# Patient Record
Sex: Female | Born: 1985 | Race: White | Hispanic: No | Marital: Married | Smoking: Never smoker
Health system: Southern US, Community
[De-identification: ages and names within clinical notes are randomized; demographics above are authoritative.]

## PROBLEM LIST (undated history)

## (undated) DIAGNOSIS — Z91410 Personal history of adult physical and sexual abuse: Secondary | ICD-10-CM

## (undated) DIAGNOSIS — Z789 Other specified health status: Secondary | ICD-10-CM

## (undated) HISTORY — PX: KNEE SURGERY: SHX244

## (undated) HISTORY — PX: ANKLE SURGERY: SHX546

## (undated) HISTORY — PX: CYSTOSCOPY W/ STONE MANIPULATION: SHX1427

## (undated) HISTORY — PX: TONSILLECTOMY: SUR1361

## (undated) HISTORY — PX: WISDOM TOOTH EXTRACTION: SHX21

## (undated) HISTORY — DX: Personal history of adult physical and sexual abuse: Z91.410

---
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
## 2012-01-17 ENCOUNTER — Telehealth: Payer: Self-pay

## 2012-01-17 NOTE — Telephone Encounter (Signed)
.  [facility] PT WOULD LIKE TO KNOW IF SHE CAN GET SOME ADDERALL. IS STARTING [employer] IN [place] AND WON'T BE ABLE TO GET A DR Breanna Davis PLEASE CALL [phone]

## 2012-01-17 NOTE — Telephone Encounter (Signed)
Pls pull chart and send msg to pa pool 

## 2012-01-18 NOTE — Telephone Encounter (Signed)
For Dr. Merla Riches to review.  Patient last seen 11/18/2009.

## 2012-01-18 NOTE — Telephone Encounter (Signed)
I'd like to help but since more than 12 months, can't prescribe w/o an office visit

## 2012-01-18 NOTE — Telephone Encounter (Signed)
Chart pulled to PA 

## 2012-01-19 ENCOUNTER — Encounter: Payer: Self-pay | Admitting: Radiology

## 2012-01-19 NOTE — Telephone Encounter (Signed)
Spoke with pt and let her know she will need to rtc for office visit.

## 2014-09-10 ENCOUNTER — Observation Stay (HOSPITAL_COMMUNITY)
Admission: EM | Admit: 2014-09-10 | Discharge: 2014-09-12 | Disposition: A | Discharge status: Home / Self Care | Payer: BC Managed Care – PPO | Attending: General Surgery | Admitting: General Surgery

## 2014-09-10 ENCOUNTER — Encounter (HOSPITAL_COMMUNITY): Payer: Self-pay | Admitting: Emergency Medicine

## 2014-09-10 DIAGNOSIS — K819 Cholecystitis, unspecified: Secondary | ICD-10-CM | POA: Diagnosis present

## 2014-09-10 DIAGNOSIS — R109 Unspecified abdominal pain: Secondary | ICD-10-CM

## 2014-09-10 DIAGNOSIS — K8000 Calculus of gallbladder with acute cholecystitis without obstruction: Secondary | ICD-10-CM | POA: Diagnosis present

## 2014-09-10 DIAGNOSIS — Z79899 Other long term (current) drug therapy: Secondary | ICD-10-CM | POA: Insufficient documentation

## 2014-09-10 DIAGNOSIS — Z419 Encounter for procedure for purposes other than remedying health state, unspecified: Secondary | ICD-10-CM

## 2014-09-10 DIAGNOSIS — K8010 Calculus of gallbladder with chronic cholecystitis without obstruction: Principal | ICD-10-CM | POA: Insufficient documentation

## 2014-09-10 DIAGNOSIS — R1031 Right lower quadrant pain: Secondary | ICD-10-CM

## 2014-09-10 LAB — CBC WITH DIFFERENTIAL/PLATELET
BASOS ABS: 0 10*3/uL (ref 0.0–0.1)
BASOS PCT: 0 % (ref 0–1)
EOS ABS: 0 10*3/uL (ref 0.0–0.7)
Eosinophils Relative: 0 % (ref 0–5)
HEMATOCRIT: 39.3 % (ref 36.0–46.0)
HEMOGLOBIN: 13.5 g/dL (ref 12.0–15.0)
LYMPHS PCT: 10 % — AB (ref 12–46)
Lymphs Abs: 1.3 10*3/uL (ref 0.7–4.0)
MCH: 33.5 pg (ref 26.0–34.0)
MCHC: 34.4 g/dL (ref 30.0–36.0)
MCV: 97.5 fL (ref 78.0–100.0)
MONOS PCT: 2 % — AB (ref 3–12)
Monocytes Absolute: 0.2 10*3/uL (ref 0.1–1.0)
NEUTROS PCT: 88 % — AB (ref 43–77)
Neutro Abs: 11.5 10*3/uL — ABNORMAL HIGH (ref 1.7–7.7)
Platelets: 391 10*3/uL (ref 150–400)
RBC: 4.03 MIL/uL (ref 3.87–5.11)
RDW: 12.5 % (ref 11.5–15.5)
WBC: 13.1 10*3/uL — ABNORMAL HIGH (ref 4.0–10.5)

## 2014-09-10 LAB — PREGNANCY, URINE: Preg Test, Ur: NEGATIVE

## 2014-09-10 LAB — URINALYSIS, ROUTINE W REFLEX MICROSCOPIC
Bilirubin Urine: NEGATIVE
Glucose, UA: NEGATIVE mg/dL
Hgb urine dipstick: NEGATIVE
KETONES UR: 15 mg/dL — AB
Nitrite: NEGATIVE
PH: 8.5 — AB (ref 5.0–8.0)
Protein, ur: 30 mg/dL — AB
Specific Gravity, Urine: 1.021 (ref 1.005–1.030)
Urobilinogen, UA: 0.2 mg/dL (ref 0.0–1.0)

## 2014-09-10 LAB — URINE MICROSCOPIC-ADD ON

## 2014-09-10 MED ORDER — SODIUM CHLORIDE 0.9 % IV SOLN
Freq: Once | INTRAVENOUS | Status: AC
  Administered 2014-09-10: via INTRAVENOUS

## 2014-09-10 MED ORDER — FENTANYL CITRATE 0.05 MG/ML IJ SOLN
75.0000 ug | Freq: Once | INTRAMUSCULAR | Status: AC
  Administered 2014-09-10: 75 ug via INTRAVENOUS
  Filled 2014-09-10: qty 2

## 2014-09-10 MED ORDER — ONDANSETRON HCL 4 MG/2ML IJ SOLN
4.0000 mg | Freq: Once | INTRAMUSCULAR | Status: AC
  Administered 2014-09-10: 4 mg via INTRAVENOUS
  Filled 2014-09-10: qty 2

## 2014-09-10 NOTE — ED Provider Notes (Signed)
CSN: [id number]     Arrival date & time 09/10/14  2240 History   First MD Initiated Contact with Patient 09/10/14 2303     Chief Complaint  Patient presents with  . Abdominal Pain  . Emesis     (Consider location/radiation/quality/duration/timing/severity/associated sxs/prior Treatment) HPI 28 yo female presents to the ER from home with complaint of right sided abdominal pain associated with nausea and vomiting starting this evening around 6 pm.  Pt denies fever, chills, urinary symptoms, vaginal discharge.  LMP 3 weeks ago. History reviewed. No pertinent past medical history. Past Surgical History  Procedure Laterality Date  . Tonsillectomy    . Knee surgery    . Ankle surgery    . Cystoscopy w/ stone manipulation     Family History  Problem Relation Age of Onset  . Stroke Mother   . Hypertension Other   . Stroke Other   . Diabetes Other    History  Substance Use Topics  . Smoking status: Never Smoker   . Smokeless tobacco: Not on file  . Alcohol Use: Yes     Comment: occ   OB History   Grav Para Term Preterm Abortions TAB SAB Ect Mult Living                 Review of Systems   See History of Present Illness; otherwise all other systems are reviewed and negative  Allergies  Review of patient's allergies indicates no known allergies.  Home Medications   Prior to Admission medications   Not on File   BP 119/87  Pulse 73  Temp(Src) 97.5 F (36.4 C) (Oral)  Resp 18  Ht 5' (1.524 m)  Wt 92 lb (41.731 kg)  BMI 17.97 kg/m2  SpO2 92%  LMP 08/14/2014 Physical Exam  Nursing note and vitals reviewed. Constitutional: She is oriented to person, place, and time. She appears well-developed and well-nourished.  HENT:  Head: Normocephalic and atraumatic.  Nose: Nose normal.  Mouth/Throat: Oropharynx is clear and moist.  Eyes: Conjunctivae and EOM are normal. Pupils are equal, round, and reactive to light.  Neck: Normal range of motion. Neck supple. No JVD  present. No tracheal deviation present. No thyromegaly present.  Cardiovascular: Normal rate, regular rhythm, normal heart sounds and intact distal pulses.  Exam reveals no gallop and no friction rub.   No murmur heard. Pulmonary/Chest: Effort normal and breath sounds normal. No stridor. No respiratory distress. She has no wheezes. She has no rales. She exhibits no tenderness.  Abdominal: Soft. Bowel sounds are normal. She exhibits no distension and no mass. There is tenderness (tenderness to palpation all along the right side of abdomen, no rebound or guarding). There is no rebound and no guarding.  Genitourinary:  External genitalia normal Vagina without discharge Cervix  normal negative for cervical motion tenderness Adnexa palpated, no masses or negative for tenderness noted Bladder palpated negative for tenderness Uterus palpated no masses or negative for tenderness    Musculoskeletal: Normal range of motion. She exhibits no edema and no tenderness.  Lymphadenopathy:    She has no cervical adenopathy.  Neurological: She is alert and oriented to person, place, and time. She displays normal reflexes. She exhibits normal muscle tone. Coordination normal.  Skin: Skin is warm and dry. No rash noted. No erythema. No pallor.  Psychiatric: She has a normal mood and affect. Her behavior is normal. Judgment and thought content normal.    ED Course  Procedures (including critical care time) Labs Review  Labs Reviewed  CBC WITH DIFFERENTIAL - Abnormal; Notable for the following:    WBC 13.1 (*)    Neutrophils Relative % 88 (*)    Neutro Abs 11.5 (*)    Lymphocytes Relative 10 (*)    Monocytes Relative 2 (*)    All other components within normal limits  BASIC METABOLIC PANEL - Abnormal; Notable for the following:    Potassium 3.6 (*)    Glucose, Bld 110 (*)    All other components within normal limits  URINALYSIS, ROUTINE W REFLEX MICROSCOPIC - Abnormal; Notable for the following:     APPearance TURBID (*)    pH 8.5 (*)    Ketones, ur 15 (*)    Protein, ur 30 (*)    Leukocytes, UA TRACE (*)    All other components within normal limits  URINE MICROSCOPIC-ADD ON - Abnormal; Notable for the following:    Squamous Epithelial / LPF FEW (*)    All other components within normal limits  HEPATIC FUNCTION PANEL - Abnormal; Notable for the following:    Albumin 3.1 (*)    All other components within normal limits  WET PREP, GENITAL  PREGNANCY, URINE    Imaging Review Ct Abdomen Pelvis W Contrast  09/11/2014   CLINICAL DATA:  Right lower quadrant pain  EXAM: CT ABDOMEN AND PELVIS WITH CONTRAST  TECHNIQUE: Multidetector CT imaging of the abdomen and pelvis was performed using the standard protocol following bolus administration of intravenous contrast.  CONTRAST:  80mL OMNIPAQUE IOHEXOL 300 MG/ML  SOLN  COMPARISON:  None.  FINDINGS: Lung bases clear. Fluid distention of the distal esophagus. Normal heart size.  Mild biliary ductal dilatation, intra and extrahepatic, appears to smoothly taper toward the level of the ampulla. The gallbladder is distended. Layering high density on series 2, image 30 2 May reflect stones or wall calcification. No wall thickening. Pericholecystic fluid, mild (series 2, image 28).  No appreciable abnormality of the pancreas, spleen, adrenal glands, kidneys. No hydroureteronephrosis.  No overt colitis. Normal appendix. Small bowel loops are of normal course and caliber. No free intraperitoneal air. No loculated fluid collection. No lymphadenopathy.  Normal caliber aorta and branch vessels.  Thin walled bladder. Nonspecific peripheral enhancement of the uterine parenchyma. No adnexal mass. Small amount of free fluid within the pelvis is presumably physiologic.  No acute osseous finding.  IMPRESSION: Distended gallbladder. Small amount of pericholecystic fluid. Acute cholecystitis is not excluded. Recommend ultrasound.  Mild biliary ductal prominence is  nonspecific. Correlate with LFTs and ERCP if warranted.  Fluid distention of the distal esophagus may reflect reflux.   Electronically Signed   By: Jearld Lesch M.D.   On: 09/11/2014 03:23   US Abdomen Limited  09/11/2014   CLINICAL DATA:  Abdominal pain  EXAM: US ABDOMEN LIMITED - RIGHT UPPER QUADRANT  COMPARISON:  09/11/2014 CT  FINDINGS: Gallbladder:  Gallstones. Mild gallbladder thickening up to 3 mm. Negative sonographic Murphy sign.  Common bile duct:  Diameter: 6 mm where seen. The distal duct is poorly visualized due to overlying bowel gas artifact.  Liver:  No focal lesion identified. Within normal limits in parenchymal echogenicity.  IMPRESSION: Cholelithiasis and mild gallbladder wall thickening however negative sonographic Murphy sign. Acute cholecystitis is not excluded in the appropriate clinical setting. If clinically equivocal, consider HIDA scan.  Similar mild proximal-mid CBD prominence. The distal duct is obscured.   Electronically Signed   By: Jearld Lesch M.D.   On: 09/11/2014 04:57  EKG Interpretation None      MDM   Final diagnoses:  RLQ abdominal pain  Abdominal pain  Calculus of gallbladder with acute cholecystitis without obstruction    28 year old female with acute onset of right-sided abdominal pain nausea and vomiting.  Plan for labs, pelvic exam, Zofran and fentanyl, IV fluids.  12:31 AM Pelvic exam normal, no adnexal pain.  Will proceed with CT abdomen and pelvis for possible appendicitis.  No signs of kidney stone or infected urine.  CBC noted to have slight elevation in WBC with left shift.  5:34 AM Enlarged gallbladder on CT with some pericholecystic fluid.  U/s with some GB wall thickening.  Pt has had persistent pain despite medications, nausea.  Still tender on my exam.  Will d/w surgery.  5:50 AM Case d/w Dr Johna SheriffHoxworth.  He requests holding order replaced.  He reports surgery team will see her at 7 AM.   Olivia Mackielga M Laurie Lovejoy, MD 09/11/14 [phone]

## 2014-09-10 NOTE — ED Notes (Signed)
Pt states she is having pain in her lower right quadrant that started about 6pm tonight  Pt states she has had vomiting with the pain  Denies back pain

## 2014-09-11 ENCOUNTER — Encounter (HOSPITAL_COMMUNITY): Payer: Self-pay

## 2014-09-11 ENCOUNTER — Emergency Department (HOSPITAL_COMMUNITY): Payer: BC Managed Care – PPO

## 2014-09-11 ENCOUNTER — Inpatient Hospital Stay (HOSPITAL_COMMUNITY): Payer: BC Managed Care – PPO

## 2014-09-11 ENCOUNTER — Encounter (HOSPITAL_COMMUNITY): Payer: BC Managed Care – PPO | Admitting: Anesthesiology

## 2014-09-11 ENCOUNTER — Inpatient Hospital Stay (HOSPITAL_COMMUNITY): Payer: BC Managed Care – PPO | Admitting: Anesthesiology

## 2014-09-11 ENCOUNTER — Encounter (HOSPITAL_COMMUNITY): Admission: EM | Disposition: A | Discharge status: Home / Self Care | Payer: Self-pay | Source: Home / Self Care

## 2014-09-11 DIAGNOSIS — K819 Cholecystitis, unspecified: Secondary | ICD-10-CM | POA: Diagnosis present

## 2014-09-11 DIAGNOSIS — K8000 Calculus of gallbladder with acute cholecystitis without obstruction: Secondary | ICD-10-CM | POA: Diagnosis present

## 2014-09-11 HISTORY — PX: CHOLECYSTECTOMY: SHX55

## 2014-09-11 LAB — BASIC METABOLIC PANEL
Anion gap: 14 (ref 5–15)
BUN: 9 mg/dL (ref 6–23)
CO2: 26 mEq/L (ref 19–32)
Calcium: 9.2 mg/dL (ref 8.4–10.5)
Chloride: 97 mEq/L (ref 96–112)
Creatinine, Ser: 0.69 mg/dL (ref 0.50–1.10)
GFR calc Af Amer: >90 mL/min (ref >90)
GFR calc non Af Amer: >90 mL/min (ref >90)
Glucose, Bld: 110 mg/dL — ABNORMAL HIGH (ref 70–99)
Potassium: 3.6 mEq/L — ABNORMAL LOW (ref 3.7–5.3)
SODIUM: 137 meq/L (ref 137–147)

## 2014-09-11 LAB — WET PREP, GENITAL
CLUE CELLS WET PREP: NONE SEEN
Trich, Wet Prep: NONE SEEN
WBC WET PREP: NONE SEEN
YEAST WET PREP: NONE SEEN

## 2014-09-11 LAB — SURGICAL PCR SCREEN
MRSA, PCR: NEGATIVE
Staphylococcus aureus: POSITIVE — AB

## 2014-09-11 LAB — HEPATIC FUNCTION PANEL
ALT: 13 U/L (ref 0–35)
AST: 21 U/L (ref 0–37)
Albumin: 3.1 g/dL — ABNORMAL LOW (ref 3.5–5.2)
Alkaline Phosphatase: 60 U/L (ref 39–117)
Total Bilirubin: 0.3 mg/dL (ref 0.3–1.2)
Total Protein: 6.2 g/dL (ref 6.0–8.3)

## 2014-09-11 SURGERY — LAPAROSCOPIC CHOLECYSTECTOMY WITH INTRAOPERATIVE CHOLANGIOGRAM
Anesthesia: General | Site: Abdomen

## 2014-09-11 MED ORDER — NEOSTIGMINE METHYLSULFATE 10 MG/10ML IV SOLN
INTRAVENOUS | Status: DC | PRN
Start: 2014-09-11 — End: 2014-09-11
  Administered 2014-09-11: 3 mg via INTRAVENOUS

## 2014-09-11 MED ORDER — ONDANSETRON HCL 4 MG/2ML IJ SOLN: 4.0000 mg | INTRAMUSCULAR | Status: DC | PRN

## 2014-09-11 MED ORDER — ONDANSETRON HCL 4 MG/2ML IJ SOLN
INTRAMUSCULAR | Status: AC
  Filled 2014-09-11: qty 2

## 2014-09-11 MED ORDER — ONDANSETRON HCL 4 MG PO TABS: 4.0000 mg | ORAL_TABLET | Freq: Four times a day (QID) | ORAL | Status: DC | PRN

## 2014-09-11 MED ORDER — SODIUM CHLORIDE 0.9 % IV SOLN
INTRAVENOUS | Status: AC
  Administered 2014-09-11: 08:00:00 via INTRAVENOUS

## 2014-09-11 MED ORDER — MORPHINE SULFATE 4 MG/ML IJ SOLN
4.0000 mg | Freq: Once | INTRAMUSCULAR | Status: AC
Start: 2014-09-11 — End: 2014-09-11
  Administered 2014-09-11: 4 mg via INTRAVENOUS
  Filled 2014-09-11: qty 1

## 2014-09-11 MED ORDER — MORPHINE SULFATE 2 MG/ML IJ SOLN: 2.0000 mg | INTRAMUSCULAR | Status: DC | PRN

## 2014-09-11 MED ORDER — FENTANYL CITRATE 0.05 MG/ML IJ SOLN
INTRAMUSCULAR | Status: AC
  Filled 2014-09-11: qty 2

## 2014-09-11 MED ORDER — GLYCOPYRROLATE 0.2 MG/ML IJ SOLN
INTRAMUSCULAR | Status: AC
  Filled 2014-09-11: qty 2

## 2014-09-11 MED ORDER — FENTANYL CITRATE 0.05 MG/ML IJ SOLN
100.0000 ug | Freq: Once | INTRAMUSCULAR | Status: AC
  Administered 2014-09-11: 100 ug via INTRAVENOUS
  Filled 2014-09-11: qty 2

## 2014-09-11 MED ORDER — AMPHETAMINE-DEXTROAMPHETAMINE 20 MG PO TABS
20.0000 mg | ORAL_TABLET | Freq: Two times a day (BID) | ORAL | Status: DC
  Administered 2014-09-12: 20 mg via ORAL
  Filled 2014-09-11 (×2): qty 1

## 2014-09-11 MED ORDER — FENTANYL CITRATE 0.05 MG/ML IJ SOLN: 25.0000 ug | INTRAMUSCULAR | Status: DC | PRN

## 2014-09-11 MED ORDER — KCL-LACTATED RINGERS-D5W 20 MEQ/L IV SOLN
INTRAVENOUS | Status: DC
  Administered 2014-09-11 – 2014-09-12 (×2): 100 mL/h via INTRAVENOUS
  Filled 2014-09-11 (×3): qty 1000

## 2014-09-11 MED ORDER — LIDOCAINE HCL (CARDIAC) 20 MG/ML IV SOLN
INTRAVENOUS | Status: DC | PRN
  Administered 2014-09-11: 100 mg via INTRAVENOUS

## 2014-09-11 MED ORDER — IOHEXOL 300 MG/ML  SOLN
50.0000 mL | Freq: Once | INTRAMUSCULAR | Status: AC | PRN
  Administered 2014-09-11: 50 mL via ORAL

## 2014-09-11 MED ORDER — PHENYLEPHRINE HCL 10 MG/ML IJ SOLN
INTRAMUSCULAR | Status: DC | PRN
  Administered 2014-09-11: 80 ug via INTRAVENOUS

## 2014-09-11 MED ORDER — FENTANYL CITRATE 0.05 MG/ML IJ SOLN
100.0000 ug | INTRAMUSCULAR | Status: DC | PRN
  Administered 2014-09-11: 100 ug via INTRAVENOUS
  Filled 2014-09-11: qty 2

## 2014-09-11 MED ORDER — MIDAZOLAM HCL 5 MG/5ML IJ SOLN
INTRAMUSCULAR | Status: DC | PRN
  Administered 2014-09-11: 2 mg via INTRAVENOUS

## 2014-09-11 MED ORDER — PROPOFOL 10 MG/ML IV BOLUS
INTRAVENOUS | Status: AC
  Filled 2014-09-11: qty 20

## 2014-09-11 MED ORDER — BUPIVACAINE HCL 0.5 % IJ SOLN
INTRAMUSCULAR | Status: DC | PRN
  Administered 2014-09-11: 15 mL

## 2014-09-11 MED ORDER — DIPHENHYDRAMINE HCL 50 MG/ML IJ SOLN
12.5000 mg | Freq: Once | INTRAMUSCULAR | Status: AC
  Administered 2014-09-11: 12.5 mg via INTRAVENOUS
  Filled 2014-09-11: qty 1

## 2014-09-11 MED ORDER — ONDANSETRON HCL 4 MG/2ML IJ SOLN
4.0000 mg | Freq: Three times a day (TID) | INTRAMUSCULAR | Status: DC | PRN
  Administered 2014-09-11: 4 mg via INTRAVENOUS
  Filled 2014-09-11: qty 2

## 2014-09-11 MED ORDER — CEFAZOLIN SODIUM-DEXTROSE 2-3 GM-% IV SOLR
INTRAVENOUS | Status: AC
  Filled 2014-09-11: qty 50

## 2014-09-11 MED ORDER — IOHEXOL 300 MG/ML  SOLN
80.0000 mL | Freq: Once | INTRAMUSCULAR | Status: AC | PRN
  Administered 2014-09-11: 80 mL via INTRAVENOUS

## 2014-09-11 MED ORDER — DEXAMETHASONE SODIUM PHOSPHATE 10 MG/ML IJ SOLN
INTRAMUSCULAR | Status: DC | PRN
  Administered 2014-09-11: 10 mg via INTRAVENOUS

## 2014-09-11 MED ORDER — ROCURONIUM BROMIDE 100 MG/10ML IV SOLN
INTRAVENOUS | Status: DC | PRN
Start: 2014-09-11 — End: 2014-09-11
  Administered 2014-09-11: 20 mg via INTRAVENOUS

## 2014-09-11 MED ORDER — HEPARIN SODIUM (PORCINE) 5000 UNIT/ML IJ SOLN
5000.0000 [IU] | Freq: Three times a day (TID) | INTRAMUSCULAR | Status: DC
  Filled 2014-09-11 (×3): qty 1

## 2014-09-11 MED ORDER — DEXAMETHASONE SODIUM PHOSPHATE 10 MG/ML IJ SOLN
INTRAMUSCULAR | Status: AC
  Filled 2014-09-11: qty 1

## 2014-09-11 MED ORDER — LACTATED RINGERS IV SOLN
INTRAVENOUS | Status: DC
Start: 2014-09-11 — End: 2014-09-11
  Administered 2014-09-11: 14:00:00 via INTRAVENOUS
  Administered 2014-09-11: 1000 mL via INTRAVENOUS

## 2014-09-11 MED ORDER — IOHEXOL 300 MG/ML  SOLN
INTRAMUSCULAR | Status: DC | PRN
  Administered 2014-09-11: 2 mL

## 2014-09-11 MED ORDER — PHENYLEPHRINE 40 MCG/ML (10ML) SYRINGE FOR IV PUSH (FOR BLOOD PRESSURE SUPPORT)
PREFILLED_SYRINGE | INTRAVENOUS | Status: AC
  Filled 2014-09-11: qty 10

## 2014-09-11 MED ORDER — PROPOFOL 10 MG/ML IV BOLUS
INTRAVENOUS | Status: DC | PRN
  Administered 2014-09-11: 150 mg via INTRAVENOUS

## 2014-09-11 MED ORDER — LACTATED RINGERS IR SOLN
Status: DC | PRN
  Administered 2014-09-11: 1000 mL

## 2014-09-11 MED ORDER — CEFAZOLIN SODIUM-DEXTROSE 2-3 GM-% IV SOLR
2.0000 g | Freq: Once | INTRAVENOUS | Status: AC
  Administered 2014-09-11: 2 g via INTRAVENOUS
  Filled 2014-09-11: qty 50

## 2014-09-11 MED ORDER — KETOROLAC TROMETHAMINE 30 MG/ML IJ SOLN
INTRAMUSCULAR | Status: DC | PRN
  Administered 2014-09-11: 30 mg via INTRAVENOUS

## 2014-09-11 MED ORDER — ONDANSETRON HCL 4 MG/2ML IJ SOLN: 4.0000 mg | Freq: Once | INTRAMUSCULAR | Status: DC | PRN

## 2014-09-11 MED ORDER — 0.9 % SODIUM CHLORIDE (POUR BTL) OPTIME
TOPICAL | Status: DC | PRN
  Administered 2014-09-11: 1000 mL

## 2014-09-11 MED ORDER — SUCCINYLCHOLINE CHLORIDE 20 MG/ML IJ SOLN
INTRAMUSCULAR | Status: DC | PRN
  Administered 2014-09-11: 100 mg via INTRAVENOUS

## 2014-09-11 MED ORDER — FENTANYL CITRATE 0.05 MG/ML IJ SOLN
INTRAMUSCULAR | Status: DC | PRN
  Administered 2014-09-11 (×4): 50 ug via INTRAVENOUS

## 2014-09-11 MED ORDER — KETOROLAC TROMETHAMINE 30 MG/ML IJ SOLN
INTRAMUSCULAR | Status: AC
  Filled 2014-09-11: qty 1

## 2014-09-11 MED ORDER — BUPIVACAINE HCL (PF) 0.5 % IJ SOLN
INTRAMUSCULAR | Status: AC
  Filled 2014-09-11: qty 30

## 2014-09-11 MED ORDER — OXYCODONE-ACETAMINOPHEN 5-325 MG PO TABS
1.0000 | ORAL_TABLET | ORAL | Status: DC | PRN
  Administered 2014-09-11: 1 via ORAL
  Filled 2014-09-11: qty 1

## 2014-09-11 MED ORDER — CEFAZOLIN SODIUM-DEXTROSE 2-3 GM-% IV SOLR
2.0000 g | Freq: Once | INTRAVENOUS | Status: AC
  Administered 2014-09-11: 2 g via INTRAVENOUS

## 2014-09-11 MED ORDER — MIDAZOLAM HCL 2 MG/2ML IJ SOLN
INTRAMUSCULAR | Status: AC
  Filled 2014-09-11: qty 2

## 2014-09-11 MED ORDER — CEFAZOLIN SODIUM 1-5 GM-% IV SOLN
1.0000 g | Freq: Four times a day (QID) | INTRAVENOUS | Status: AC
  Administered 2014-09-11 – 2014-09-12 (×3): 1 g via INTRAVENOUS
  Filled 2014-09-11 (×3): qty 50

## 2014-09-11 MED ORDER — ONDANSETRON HCL 4 MG/2ML IJ SOLN
INTRAMUSCULAR | Status: DC | PRN
Start: 2014-09-11 — End: 2014-09-11
  Administered 2014-09-11: 4 mg via INTRAVENOUS

## 2014-09-11 MED ORDER — ONDANSETRON HCL 4 MG/2ML IJ SOLN
4.0000 mg | Freq: Once | INTRAMUSCULAR | Status: AC
  Administered 2014-09-11: 4 mg via INTRAVENOUS
  Filled 2014-09-11: qty 2

## 2014-09-11 MED ORDER — GLYCOPYRROLATE 0.2 MG/ML IJ SOLN
INTRAMUSCULAR | Status: DC | PRN
Start: 2014-09-11 — End: 2014-09-11
  Administered 2014-09-11: 0.4 mg via INTRAVENOUS

## 2014-09-11 SURGICAL SUPPLY — 50 items
APL SKNCLS STERI-STRIP NONHPOA (GAUZE/BANDAGES/DRESSINGS) ×1
APPLIER CLIP 5 13 M/L LIGAMAX5 (MISCELLANEOUS) ×2
APR CLP MED LRG 5 ANG JAW (MISCELLANEOUS) ×1
BAG SPEC RTRVL LRG 6X4 10 (ENDOMECHANICALS) ×1
BENZOIN TINCTURE PRP APPL 2/3 (GAUZE/BANDAGES/DRESSINGS) ×2 IMPLANT
CATH REDDICK CHOLANGI 4FR 50CM (CATHETERS) ×1 IMPLANT
CHLORAPREP W/TINT 26ML (MISCELLANEOUS) ×2 IMPLANT
CLIP APPLIE 5 13 M/L LIGAMAX5 (MISCELLANEOUS) ×1 IMPLANT
COVER MAYO STAND STRL (DRAPES) ×2 IMPLANT
DISSECTOR BLUNT TIP ENDO 5MM (MISCELLANEOUS) IMPLANT
DRAPE C-ARM 42X120 X-RAY (DRAPES) ×2 IMPLANT
DRAPE LAPAROSCOPIC ABDOMINAL (DRAPES) ×2 IMPLANT
DRAPE UTILITY XL STRL (DRAPES) ×2 IMPLANT
DRSG TEGADERM 2-3/8X2-3/4 SM (GAUZE/BANDAGES/DRESSINGS) ×6 IMPLANT
ELECT REM PT RETURN 9FT ADLT (ELECTROSURGICAL) ×2
ELECTRODE REM PT RTRN 9FT ADLT (ELECTROSURGICAL) ×1 IMPLANT
ENDOLOOP SUT PDS II  0 18 (SUTURE)
ENDOLOOP SUT PDS II 0 18 (SUTURE) IMPLANT
GAUZE SPONGE 2X2 8PLY STRL LF (GAUZE/BANDAGES/DRESSINGS) ×1 IMPLANT
GLOVE BIO SURGEON STRL SZ7.5 (GLOVE) ×1 IMPLANT
GLOVE BIOGEL PI IND STRL 6.5 (GLOVE) IMPLANT
GLOVE BIOGEL PI IND STRL 7.5 (GLOVE) IMPLANT
GLOVE BIOGEL PI IND STRL 8 (GLOVE) IMPLANT
GLOVE BIOGEL PI INDICATOR 6.5 (GLOVE) ×1
GLOVE BIOGEL PI INDICATOR 7.5 (GLOVE) ×1
GLOVE BIOGEL PI INDICATOR 8 (GLOVE) ×1
GLOVE ECLIPSE 7.5 STRL STRAW (GLOVE) ×1 IMPLANT
GLOVE ECLIPSE 8.0 STRL XLNG CF (GLOVE) ×2 IMPLANT
GLOVE INDICATOR 8.0 STRL GRN (GLOVE) ×2 IMPLANT
GOWN STRL REUS W/TWL 2XL LVL3 (GOWN DISPOSABLE) ×1 IMPLANT
GOWN STRL REUS W/TWL XL LVL3 (GOWN DISPOSABLE) ×5 IMPLANT
HEMOSTAT SNOW SURGICEL 2X4 (HEMOSTASIS) ×1 IMPLANT
IV CATH 14GX2 1/4 (CATHETERS) ×1 IMPLANT
KIT BASIN OR (CUSTOM PROCEDURE TRAY) ×2 IMPLANT
POUCH SPECIMEN RETRIEVAL 10MM (ENDOMECHANICALS) ×2 IMPLANT
SCISSORS LAP 5X35 DISP (ENDOMECHANICALS) ×2 IMPLANT
SET CHOLANGIOGRAPH MIX (MISCELLANEOUS) ×2 IMPLANT
SET IRRIG TUBING LAPAROSCOPIC (IRRIGATION / IRRIGATOR) ×2 IMPLANT
SLEEVE XCEL OPT CAN 5 100 (ENDOMECHANICALS) ×4 IMPLANT
SOLUTION ANTI FOG 6CC (MISCELLANEOUS) ×2 IMPLANT
SPONGE GAUZE 2X2 STER 10/PKG (GAUZE/BANDAGES/DRESSINGS) ×1
STRIP CLOSURE SKIN 1/2X4 (GAUZE/BANDAGES/DRESSINGS) ×2 IMPLANT
SUT MNCRL AB 4-0 PS2 18 (SUTURE) ×2 IMPLANT
TOWEL OR 17X26 10 PK STRL BLUE (TOWEL DISPOSABLE) ×2 IMPLANT
TOWEL OR NON WOVEN STRL DISP B (DISPOSABLE) ×2 IMPLANT
TRAY LAPAROSCOPIC (CUSTOM PROCEDURE TRAY) ×2 IMPLANT
TROCAR BLADELESS OPT 5 100 (ENDOMECHANICALS) ×2 IMPLANT
TROCAR XCEL BLUNT TIP 100MML (ENDOMECHANICALS) ×2 IMPLANT
TROCAR XCEL NON-BLD 11X100MML (ENDOMECHANICALS) IMPLANT
TUBING INSUFFLATION 10FT LAP (TUBING) ×2 IMPLANT

## 2014-09-11 NOTE — Anesthesia Postprocedure Evaluation (Signed)
Anesthesia Post Note  Patient: Breanna Davis  Procedure(s) Performed: Procedure(s) (LRB): LAPAROSCOPIC CHOLECYSTECTOMY WITH INTRAOPERATIVE CHOLANGIOGRAM (N/A)  Anesthesia type: General  Patient location: PACU  Post pain: Pain level controlled  Post assessment: Post-op Vital signs reviewed  Last Vitals: BP 98/63  Pulse 79  Temp(Src) 36.5 C (Oral)  Resp 18  Ht 5' (1.524 m)  Wt 92 lb (41.731 kg)  BMI 17.97 kg/m2  SpO2 100%  LMP 08/14/2014  Post vital signs: Reviewed  Level of consciousness: sedated  Complications: No apparent anesthesia complications

## 2014-09-11 NOTE — Op Note (Signed)
Preoperative diagnosis:  Acute calculus cholecystitis  Postoperative diagnosis:  same  Procedure: Laparoscopic cholecystectomy with cholangiogram.  Surgeon: Avel Peaceodd Suede Greenawalt, M.D.  Asst.:  none  Anesthesia: General  Indication:   This is a 28 year old female admitted early this morning with acute calculus cholecystitis. She now is brought to the operating room for cholecystectomy.  Technique: She was brought to the operating room, placed supine on the operating table, and a general anesthetic was administered. The abdominal wall was then sterilely prepped and draped. Local anesthetic (Marcaine) was infiltrated in the subumbilical region. A small subumbilical incision was made through the skin, subcutaneous tissue, fascia, and peritoneum entering the peritoneal cavity under direct vision. A pursestring suture of 0 Vicryl was placed around the edges of the fascia. A Hassan trocar was introduced into the peritoneal cavity and a pneumoperitoneum was created by insufflation of carbon dioxide gas. The laparoscope was introduced into the trocar and no underlying bleeding or organ injury was noted. She was then placed in the reverse Trendelenburg position with the right side tilted slightly up.  Three 5 mm trocars were then placed into the abdominal cavity under laparoscopic vision. One in the epigastric area, and 2 in the right upper quadrant area. The gallbladder was visualized.  It showed signs of acute inflammation. It was tense and edematous. Needle decompression was performed and white bile was evacuated. The fundus was grasped and retracted toward the right shoulder.  The infundibulum was mobilized with dissection close to the gallbladder and retracted laterally. The cystic duct was identified and a window was created around it. An anterior branch of the cystic artery was  Identified running anterior to the cystic duct and a window was created around it. It was clipped and divided. The critical view  was achieved. A clip was placed at the neck of the gallbladder. A small incision was made in the cystic duct. A cholangiocatheter was introduced through the anterior abdominal wall and placed in the cystic duct. A intraoperative cholangiogram was then performed.  Under real-time fluoroscopy, dilute contrast was injected into the cystic duct.  The common hepatic duct, the right and left hepatic ducts, and the common duct were all visualized. Contrast drained into the duodenum without obvious evidence of any obstructing ductal lesion. The final report is pending the Radiologist's interpretation.  The cholangiocatheter was removed, the cystic duct was clipped 3 times on the biliary side, and then the cystic duct was divided sharply. No bile leak was noted from the cystic duct stump.  A posterior branch of the cystic artery was identified, isolated, then clipped and divided. Following this the gallbladder was dissected free from the liver using electrocautery. The gallbladder was then placed in a retrieval bag and removed from the abdominal cavity through the subumbilical incision.  The gallbladder fossa was inspected, irrigated, and bleeding was controlled with electrocautery. Inspection showed that hemostasis was adequate and there was no evidence of bile leak.  The irrigation fluid was evacuated as much as possible.  A piece of Surgicel was placed in the gallbladder fossa.  The subumbilical trocar was removed and the fascial defect was closed by tightening and tying down the pursestring suture under laparoscopic vision.  The remaining trocars were removed and the pneumoperitoneum was released. The skin incisions were closed with 4-0 Monocryl subcuticular stitches. Steri-Strips and sterile dressings were applied.  The procedure was well-tolerated without any apparent complications. She was taken to the recovery room in satisfactory condition.

## 2014-09-11 NOTE — Anesthesia Preprocedure Evaluation (Addendum)
Anesthesia Evaluation  Patient identified by MRN, date of birth, ID band Patient awake    Reviewed: Allergy & Precautions, H&P , NPO status , Patient's Chart, lab work & pertinent test results  History of Anesthesia Complications Negative for: history of anesthetic complications  Airway Mallampati: II  TM Distance: >3 FB Neck ROM: Full    Dental no notable dental hx. (+) Dental Advisory Given, Teeth Intact   Pulmonary neg pulmonary ROS,  breath sounds clear to auscultation  Pulmonary exam normal       Cardiovascular negative cardio ROS  Rhythm:Regular Rate:Normal     Neuro/Psych ADHDnegative neurological ROS  negative psych ROS   GI/Hepatic Neg liver ROS, GERD-  Medicated and Controlled,  Endo/Other  negative endocrine ROS  Renal/GU negative Renal ROS  negative genitourinary   Musculoskeletal negative musculoskeletal ROS (+)   Abdominal   Peds negative pediatric ROS (+)  Hematology negative hematology ROS (+)   Anesthesia Other Findings Seasonal allergies  Reproductive/Obstetrics negative OB ROS                            Anesthesia Physical Anesthesia Plan  ASA: II  Anesthesia Plan: General   Post-op Pain Management:    Induction: Intravenous, Rapid sequence and Cricoid pressure planned  Airway Management Planned: Oral ETT  Additional Equipment:   Intra-op Plan:   Post-operative Plan: Extubation in OR  Informed Consent: I have reviewed the patients History and Physical, chart, labs and discussed the procedure including the risks, benefits and alternatives for the proposed anesthesia with the patient or authorized representative who has indicated his/her understanding and acceptance.   Dental advisory given  Plan Discussed with: CRNA  Anesthesia Plan Comments:         Anesthesia Quick Evaluation

## 2014-09-11 NOTE — ED Notes (Signed)
MD at bedside. 

## 2014-09-11 NOTE — Transfer of Care (Signed)
Immediate Anesthesia Transfer of Care Note  Patient: Breanna Davis  Procedure(s) Performed: Procedure(s): LAPAROSCOPIC CHOLECYSTECTOMY WITH INTRAOPERATIVE CHOLANGIOGRAM (N/A)  Patient Location: PACU  Anesthesia Type:General  Level of Consciousness: sedated  Airway & Oxygen Therapy: Patient Spontanous Breathing and Patient connected to face mask oxygen  Post-op Assessment: Report given to PACU RN and Post -op Vital signs reviewed and stable  Post vital signs: Reviewed and stable  Complications: No apparent anesthesia complications

## 2014-09-11 NOTE — H&P (Signed)
Breanna Davis is an 28 y.o. female.   Chief Complaint: Abdominal pain with nausea and vomiting HPI: this is a 28 year old female who awoke yesterday morning with some transient epigastric pain. It resolved and she was able to go throughout her day. Last night however she had significant pain just to the right the umbilicus that was severe and was associated with nausea and vomiting. No fever or chills. She presented to the emergency department and had findings concerning for for acute cholecystitis. This is based on CT and ultrasound. Liver function tests were not elevated. White blood cell count was elevated. She was admitted to the hospital.  History reviewed. No pertinent past medical history.  Past Surgical History  Procedure Laterality Date  . Tonsillectomy    . Knee surgery    . Ankle surgery    . Cystoscopy w/ stone manipulation      Family History  Problem Relation Age of Onset  . Stroke Mother   . Hypertension Other   . Stroke Other   . Diabetes Other    Social History:  reports that she has never smoked. She does not have any smokeless tobacco history on file. She reports that she drinks alcohol. She reports that she does not use illicit drugs.  Allergies: No Known Allergies  Medications Prior to Admission  Medication Sig Dispense Refill  . amphetamine-dextroamphetamine (ADDERALL) 20 MG tablet Take 20 mg by mouth 2 (two) times daily.      . drospirenone-ethinyl estradiol (YAZ,GIANVI,LORYNA) 3-0.02 MG tablet Take 1 tablet by mouth daily.      Marland Kitchen loratadine (CLARITIN) 10 MG tablet Take 10 mg by mouth daily.      . valACYclovir (VALTREX) 1000 MG tablet Take 1,000 mg by mouth daily.        Results for orders placed during the hospital encounter of 09/10/14 (from the past 48 hour(s))  URINALYSIS, ROUTINE W REFLEX MICROSCOPIC     Status: Abnormal   Collection Time    09/10/14 11:17 PM      Result Value Ref Range   Color, Urine YELLOW  YELLOW   APPearance TURBID (*)  CLEAR   Specific Gravity, Urine 1.021  1.005 - 1.030   pH 8.5 (*) 5.0 - 8.0   Glucose, UA NEGATIVE  NEGATIVE mg/dL   Hgb urine dipstick NEGATIVE  NEGATIVE   Bilirubin Urine NEGATIVE  NEGATIVE   Ketones, ur 15 (*) NEGATIVE mg/dL   Protein, ur 30 (*) NEGATIVE mg/dL   Urobilinogen, UA 0.2  0.0 - 1.0 mg/dL   Nitrite NEGATIVE  NEGATIVE   Leukocytes, UA TRACE (*) NEGATIVE  PREGNANCY, URINE     Status: None   Collection Time    09/10/14 11:17 PM      Result Value Ref Range   Preg Test, Ur NEGATIVE  NEGATIVE   Comment:            THE SENSITIVITY OF THIS     METHODOLOGY IS >20 mIU/mL.  URINE MICROSCOPIC-ADD ON     Status: Abnormal   Collection Time    09/10/14 11:17 PM      Result Value Ref Range   Squamous Epithelial / LPF FEW (*) RARE   Urine-Other AMORPHOUS URATES/PHOSPHATES    CBC WITH DIFFERENTIAL     Status: Abnormal   Collection Time    09/10/14 11:43 PM      Result Value Ref Range   WBC 13.1 (*) 4.0 - 10.5 K/uL   RBC 4.03  3.87 -  5.11 MIL/uL   Hemoglobin 13.5  12.0 - 15.0 g/dL   HCT 39.3  36.0 - 46.0 %   MCV 97.5  78.0 - 100.0 fL   MCH 33.5  26.0 - 34.0 pg   MCHC 34.4  30.0 - 36.0 g/dL   RDW 12.5  11.5 - 15.5 %   Platelets 391  150 - 400 K/uL   Neutrophils Relative % 88 (*) 43 - 77 %   Neutro Abs 11.5 (*) 1.7 - 7.7 K/uL   Lymphocytes Relative 10 (*) 12 - 46 %   Lymphs Abs 1.3  0.7 - 4.0 K/uL   Monocytes Relative 2 (*) 3 - 12 %   Monocytes Absolute 0.2  0.1 - 1.0 K/uL   Eosinophils Relative 0  0 - 5 %   Eosinophils Absolute 0.0  0.0 - 0.7 K/uL   Basophils Relative 0  0 - 1 %   Basophils Absolute 0.0  0.0 - 0.1 K/uL  BASIC METABOLIC PANEL     Status: Abnormal   Collection Time    09/10/14 11:43 PM      Result Value Ref Range   Sodium 137  137 - 147 mEq/L   Potassium 3.6 (*) 3.7 - 5.3 mEq/L   Chloride 97  96 - 112 mEq/L   CO2 26  19 - 32 mEq/L   Glucose, Bld 110 (*) 70 - 99 mg/dL   BUN 9  6 - 23 mg/dL   Creatinine, Ser 0.69  0.50 - 1.10 mg/dL   Calcium 9.2  8.4  - 10.5 mg/dL   GFR calc non Af Amer >90  >90 mL/min   GFR calc Af Amer >90  >90 mL/min   Comment: (NOTE)     The eGFR has been calculated using the CKD EPI equation.     This calculation has not been validated in all clinical situations.     eGFR's persistently <90 mL/min signify possible Chronic Kidney     Disease.   Anion gap 14  5 - 15  WET PREP, GENITAL     Status: None   Collection Time    09/11/14 12:27 AM      Result Value Ref Range   Yeast Wet Prep HPF POC NONE SEEN  NONE SEEN   Trich, Wet Prep NONE SEEN  NONE SEEN   Clue Cells Wet Prep HPF POC NONE SEEN  NONE SEEN   WBC, Wet Prep HPF POC NONE SEEN  NONE SEEN  HEPATIC FUNCTION PANEL     Status: Abnormal   Collection Time    09/11/14  3:51 AM      Result Value Ref Range   Total Protein 6.2  6.0 - 8.3 g/dL   Albumin 3.1 (*) 3.5 - 5.2 g/dL   AST 21  0 - 37 U/L   ALT 13  0 - 35 U/L   Alkaline Phosphatase 60  39 - 117 U/L   Total Bilirubin 0.3  0.3 - 1.2 mg/dL   Bilirubin, Direct <0.2  0.0 - 0.3 mg/dL   Indirect Bilirubin NOT CALCULATED  0.3 - 0.9 mg/dL   Ct Abdomen Pelvis W Contrast  09/11/2014   CLINICAL DATA:  Right lower quadrant pain  EXAM: CT ABDOMEN AND PELVIS WITH CONTRAST  TECHNIQUE: Multidetector CT imaging of the abdomen and pelvis was performed using the standard protocol following bolus administration of intravenous contrast.  CONTRAST:  19m OMNIPAQUE IOHEXOL 300 MG/ML  SOLN  COMPARISON:  None.  FINDINGS: Lung bases clear.  Fluid distention of the distal esophagus. Normal heart size.  Mild biliary ductal dilatation, intra and extrahepatic, appears to smoothly taper toward the level of the ampulla. The gallbladder is distended. Layering high density on series 2, image 30 2 May reflect stones or wall calcification. No wall thickening. Pericholecystic fluid, mild (series 2, image 28).  No appreciable abnormality of the pancreas, spleen, adrenal glands, kidneys. No hydroureteronephrosis.  No overt colitis. Normal appendix.  Small bowel loops are of normal course and caliber. No free intraperitoneal air. No loculated fluid collection. No lymphadenopathy.  Normal caliber aorta and branch vessels.  Thin walled bladder. Nonspecific peripheral enhancement of the uterine parenchyma. No adnexal mass. Small amount of free fluid within the pelvis is presumably physiologic.  No acute osseous finding.  IMPRESSION: Distended gallbladder. Small amount of pericholecystic fluid. Acute cholecystitis is not excluded. Recommend ultrasound.  Mild biliary ductal prominence is nonspecific. Correlate with LFTs and ERCP if warranted.  Fluid distention of the distal esophagus may reflect reflux.   Electronically Signed   By: Carlos Levering M.D.   On: 09/11/2014 03:23   US Abdomen Limited  09/11/2014   CLINICAL DATA:  Abdominal pain  EXAM: US ABDOMEN LIMITED - RIGHT UPPER QUADRANT  COMPARISON:  09/11/2014 CT  FINDINGS: Gallbladder:  Gallstones. Mild gallbladder thickening up to 3 mm. Negative sonographic Murphy sign.  Common bile duct:  Diameter: 6 mm where seen. The distal duct is poorly visualized due to overlying bowel gas artifact.  Liver:  No focal lesion identified. Within normal limits in parenchymal echogenicity.  IMPRESSION: Cholelithiasis and mild gallbladder wall thickening however negative sonographic Murphy sign. Acute cholecystitis is not excluded in the appropriate clinical setting. If clinically equivocal, consider HIDA scan.  Similar mild proximal-mid CBD prominence. The distal duct is obscured.   Electronically Signed   By: Carlos Levering M.D.   On: 09/11/2014 04:57    Review of Systems  Constitutional: Negative for fever, chills and weight loss.  Respiratory: Negative for shortness of breath.   Cardiovascular: Negative for chest pain.  Gastrointestinal: Positive for nausea, vomiting and abdominal pain. Negative for diarrhea.  Genitourinary: Negative for dysuria and hematuria.  Neurological: Negative for focal weakness and  seizures.  Endo/Heme/Allergies: Does not bruise/bleed easily.    Blood pressure 99/70, pulse 80, temperature 97.8 F (36.6 C), temperature source Oral, resp. rate 15, height 5' (1.524 m), weight 92 lb (41.731 kg), last menstrual period 08/14/2014, SpO2 99.00%. Physical Exam  Constitutional: She appears well-developed and well-nourished. No distress.  HENT:  Head: Normocephalic and atraumatic.  Eyes: EOM are normal. No scleral icterus.  Neck: Neck supple.  Cardiovascular: Normal rate and regular rhythm.   Respiratory: Effort normal and breath sounds normal.  GI: Soft. She exhibits no mass. Tenderness: right upper quadrant. Guarding:  right upper quadrant.  Musculoskeletal: She exhibits no edema.  Lymphadenopathy:    She has no cervical adenopathy.  Neurological: She is alert.  Skin: Skin is warm and dry.  Psychiatric: She has a normal mood and affect. Her behavior is normal.     Assessment/Plan Acute calculus cholecystitis.  IV fluid hydration and IV antibiotics have been started.  Plan: Laparoscopic possible open cholecystectomy.  I have explained the procedure, risks, and aftercare of cholecystectomy.  Risks include but are not limited to bleeding, infection, wound problems, anesthesia, diarrhea, bile leak, injury to common bile duct/liver/intestine.  She seems to understand and agrees to proceed.   Pema Thomure J 09/11/2014, 7:47 AM

## 2014-09-12 MED ORDER — HYDROCODONE-ACETAMINOPHEN 5-325 MG PO TABS: 1.0000 | ORAL_TABLET | ORAL | Status: DC | PRN

## 2014-09-12 MED ORDER — IBUPROFEN 200 MG PO TABS: ORAL_TABLET | ORAL | Status: DC

## 2014-09-12 MED ORDER — ACETAMINOPHEN 325 MG PO TABS: 650.0000 mg | ORAL_TABLET | Freq: Four times a day (QID) | ORAL | Status: DC | PRN

## 2014-09-12 MED ORDER — DIPHENHYDRAMINE HCL 25 MG PO CAPS
25.0000 mg | ORAL_CAPSULE | Freq: Four times a day (QID) | ORAL | Status: DC | PRN
  Filled 2014-09-12: qty 1

## 2014-09-12 MED ORDER — ONDANSETRON HCL 4 MG PO TABS: 4.0000 mg | ORAL_TABLET | Freq: Four times a day (QID) | ORAL | Status: DC | PRN

## 2014-09-12 MED ORDER — HYDROMORPHONE HCL 1 MG/ML IJ SOLN: 1.0000 mg | INTRAMUSCULAR | Status: DC | PRN

## 2014-09-12 MED ORDER — DIPHENHYDRAMINE HCL 50 MG/ML IJ SOLN
12.5000 mg | Freq: Four times a day (QID) | INTRAMUSCULAR | Status: DC | PRN
  Filled 2014-09-12: qty 1

## 2014-09-12 NOTE — Progress Notes (Signed)
1 Day Post-Op  Subjective: She says pain medicines make her sick.  She is eating and doing well this AM.  Ready for discharge.  Objective: Vital signs in last 24 hours: Temp:  [97.5 F (36.4 C)-98.8 F (37.1 C)] 98.3 F (36.8 C) (10/30 0511) Pulse Rate:  [61-117] 84 (10/30 0511) Resp:  [11-18] 16 (10/30 0511) BP: (90-116)/(52-80) 90/55 mmHg (10/30 0511) SpO2:  [100 %] 100 % (10/30 0511) Last BM Date: 09/10/14  No labs Afebrile, VSS Regular diet Intake/Output from previous day: 10/29 0701 - 10/30 0700 In: 4101.7 [P.O.:800; I.V.:3301.7] Out: 1250 [Urine:1250] Intake/Output this shift:    General appearance: alert, cooperative and no distress GI: soft sore, sites look fine, she says she feels bloated.  Lab Results:   Recent Labs  09/10/14 2343  WBC 13.1*  HGB 13.5  HCT 39.3  PLT 391    BMET  Recent Labs  09/10/14 2343  NA 137  K 3.6*  CL 97  CO2 26  GLUCOSE 110*  BUN 9  CREATININE 0.69  CALCIUM 9.2   PT/INR No results found for this basename: LABPROT, INR,  in the last 72 hours   Recent Labs Lab 09/11/14 0351  AST 21  ALT 13  ALKPHOS 60  BILITOT 0.3  PROT 6.2  ALBUMIN 3.1*     Lipase  No results found for this basename: lipase     Studies/Results: Dg Cholangiogram Operative  09/11/2014   CLINICAL DATA:  Right upper quadrant abdominal pain.  EXAM: INTRAOPERATIVE CHOLANGIOGRAM  TECHNIQUE: Cholangiographic images from the C-arm fluoroscopic device were submitted for interpretation post-operatively. Please see the procedural report for the amount of contrast and the fluoroscopy time utilized.  COMPARISON:  Ultrasound of September 11, 2014.  FINDINGS: Contrast was injected into cannulated cystic duct remnant. Normal contrast filling of intrahepatic and extrahepatic biliary ducts is noted. No filling defects are noted to suggest retained stones. Antegrade flow into the duodenum is noted.  IMPRESSION: No filling defects are seen in common bile duct to  suggest retained stones.   Electronically Signed   By: Roque LiasJames  Green M.D.   On: 09/11/2014 15:35   Ct Abdomen Pelvis W Contrast  09/11/2014   CLINICAL DATA:  Right lower quadrant pain  EXAM: CT ABDOMEN AND PELVIS WITH CONTRAST  TECHNIQUE: Multidetector CT imaging of the abdomen and pelvis was performed using the standard protocol following bolus administration of intravenous contrast.  CONTRAST:  80mL OMNIPAQUE IOHEXOL 300 MG/ML  SOLN  COMPARISON:  None.  FINDINGS: Lung bases clear. Fluid distention of the distal esophagus. Normal heart size.  Mild biliary ductal dilatation, intra and extrahepatic, appears to smoothly taper toward the level of the ampulla. The gallbladder is distended. Layering high density on series 2, image 30 2 May reflect stones or wall calcification. No wall thickening. Pericholecystic fluid, mild (series 2, image 28).  No appreciable abnormality of the pancreas, spleen, adrenal glands, kidneys. No hydroureteronephrosis.  No overt colitis. Normal appendix. Small bowel loops are of normal course and caliber. No free intraperitoneal air. No loculated fluid collection. No lymphadenopathy.  Normal caliber aorta and branch vessels.  Thin walled bladder. Nonspecific peripheral enhancement of the uterine parenchyma. No adnexal mass. Small amount of free fluid within the pelvis is presumably physiologic.  No acute osseous finding.  IMPRESSION: Distended gallbladder. Small amount of pericholecystic fluid. Acute cholecystitis is not excluded. Recommend ultrasound.  Mild biliary ductal prominence is nonspecific. Correlate with LFTs and ERCP if warranted.  Fluid distention of the  distal esophagus may reflect reflux.   Electronically Signed   By: Jearld LeschAndrew  DelGaizo M.D.   On: 09/11/2014 03:23   [place] Abdomen Limited  09/11/2014   CLINICAL DATA:  Abdominal pain  EXAM: US ABDOMEN LIMITED - RIGHT UPPER QUADRANT  COMPARISON:  09/11/2014 CT  FINDINGS: Gallbladder:  Gallstones. Mild gallbladder thickening up to  3 mm. Negative sonographic Murphy sign.  Common bile duct:  Diameter: 6 mm where seen. The distal duct is poorly visualized due to overlying bowel gas artifact.  Liver:  No focal lesion identified. Within normal limits in parenchymal echogenicity.  IMPRESSION: Cholelithiasis and mild gallbladder wall thickening however negative sonographic Murphy sign. Acute cholecystitis is not excluded in the appropriate clinical setting. If clinically equivocal, consider HIDA scan.  Similar mild proximal-mid CBD prominence. The distal duct is obscured.   Electronically Signed   By: Jearld LeschAndrew  DelGaizo M.D.   On: 09/11/2014 04:57    Medications: . amphetamine-dextroamphetamine  20 mg Oral BID AC  . heparin subcutaneous  5,000 Units Subcutaneous 3 times per day    Assessment/Plan Acute calculus cholecystitis Laparoscopic cholecystectomy with cholangiogram. 09/11/2014, Avel Peaceodd Rosenbower, MD    Plan:  Home today follow up in [facility] or Dr. Abbey Chattersosenbower.   LOS: 2 days    Shakeira Rhee 09/12/2014

## 2014-09-12 NOTE — Discharge Instructions (Signed)
[facility] ______[facility] LAPAROSCOPIC SURGERY: POST OP INSTRUCTIONS Always review your discharge instruction sheet given to you by the facility where your surgery was performed. IF YOU HAVE DISABILITY OR FAMILY LEAVE FORMS, YOU MUST BRING THEM TO THE OFFICE FOR PROCESSING.   DO NOT GIVE THEM TO YOUR DOCTOR.  1. A prescription for pain medication may be given to you upon discharge.  Take your pain medication as prescribed, if needed.  If narcotic pain medicine is not needed, then you may take acetaminophen (Tylenol) or ibuprofen (Advil) as needed. 2. Take your usually prescribed medications unless otherwise directed. 3. If you need a refill on your pain medication, please contact your pharmacy.  They will contact our office to request authorization. Prescriptions will not be filled after 5pm or on week-ends. 4. You should follow a light diet the first few days after arrival home, such as soup and crackers, etc.  Be sure to include lots of fluids daily. 5. Most patients will experience some swelling and bruising in the area of the incisions.  Ice packs will help.  Swelling and bruising can take several days to resolve.  6. It is common to experience some constipation if taking pain medication after surgery.  Increasing fluid intake and taking a stool softener (such as Colace) will usually help or prevent this problem from occurring.  A mild laxative (Milk of Magnesia or Miralax) should be taken according to package instructions if there are no bowel movements after 48 hours. 7. Unless discharge instructions indicate otherwise, you may remove your bandages 72 hours after surgery, and you may shower the day after surgery.  You may have steri-strips (small skin tapes) in place directly over the incision.  These strips should be left on the skin for 14 days.  If your surgeon used skin glue on the incision, you may shower in 24 hours.  The glue will flake off over the next 2-3 weeks.  Any  sutures or staples will be removed at the office during your follow-up visit. 8. ACTIVITIES:  You may resume regular (light) daily activities beginning the next day--such as daily self-care, walking, climbing stairs--gradually increasing activities as tolerated.  You may have sexual intercourse when it is comfortable.  Refrain from any heavy lifting or straining-nothing over 10 pounds for 2 weeks.  a. You may drive when you are no longer taking prescription pain medication, you can comfortably wear a seatbelt, and you can safely maneuver your car and apply brakes. b. RETURN TO WORK:  1-2 weeks when comfortable.__________________________________________________________ 9. You should see your doctor in the office for a follow-up appointment approximately 2-3 weeks after your surgery.  Make sure that you call for this appointment within a day or two after you arrive home to insure a convenient appointment time. 10. OTHER INSTRUCTIONS: __________________________________________________________________________________________________________________________ __________________________________________________________________________________________________________________________ WHEN TO CALL YOUR DOCTOR: 1. Fever over 101.0 2. Inability to urinate 3. Continued bleeding from incision. 4. Increased pain, redness, or drainage from the incision. 5. Increasing abdominal pain  The clinic staff is available to answer your questions during regular business hours.  Please dont hesitate to call and ask to speak to one of the nurses for clinical concerns.  If you have a medical emergency, go to the nearest emergency room or call 911.  [facility] is always on call at the hospital. [address], [address], [place], [place]  [address] ? [address] [address], [place], [place]   [address] [phone] ? [phone] ?  FAX [phone] Web site: [email or web address]  Laparoscopic  Cholecystectomy, Care After Refer to this sheet in the next few weeks. These instructions provide you with information on caring for yourself after your procedure. Your health care provider may also give you more specific instructions. Your treatment has been planned according to current medical practices, but problems sometimes occur. Call your health care provider if you have any problems or questions after your procedure. WHAT TO EXPECT AFTER THE PROCEDURE After your procedure, it is typical to have the following:  Pain at your incision sites. You will be given pain medicines to control the pain.  Mild nausea or vomiting. This should improve after the first 24 hours.  Bloating and possibly shoulder pain from the gas used during the procedure. This will improve after the first 24 hours. HOME CARE INSTRUCTIONS   Change bandages (dressings) as directed by your health care provider.  Keep the wound dry and clean. You may wash the wound gently with soap and water. Gently blot or dab the area dry.  Do not take baths or use swimming pools or hot tubs for 2 weeks or until your health care provider approves.  Only take over-the-counter or prescription medicines as directed by your health care provider.  Continue your normal diet as directed by your health care provider.  Do not lift anything heavier than 10 pounds (4.5 kg) until your health care provider approves.  Do not play contact sports for 1 week or until your health care provider approves. SEEK MEDICAL CARE IF:   You have redness, swelling, or increasing pain in the wound.  You notice yellowish-white fluid (pus) coming from the wound.  You have drainage from the wound that lasts longer than 1 day.  You notice a bad smell coming from the wound or dressing.  Your surgical cuts (incisions) break open. SEEK IMMEDIATE MEDICAL CARE IF:   You develop a rash.  You have difficulty breathing.  You have chest pain.  You have a  fever.  You have increasing pain in the shoulders (shoulder strap areas).  You have dizzy episodes or faint while standing.  You have severe abdominal pain.  You feel sick to your stomach (nauseous) or throw up (vomit) and this lasts for more than 1 day. Document Released: 10/31/2005 Document Revised: 08/21/2013 Document Reviewed: 06/12/2013 [facility] Patient Information 2015 [employer] [place]. This information is not intended to replace advice given to you by your health care provider. Make sure you discuss any questions you have with your health care provider.

## 2014-09-13 NOTE — Discharge Summary (Signed)
Physician Discharge Summary  Patient ID: Breanna BalsamLauren Anderson Davis MRN: [id number] DOB/AGE: 26/05/1986 26 y.o.  Admit date: 09/10/2014 Discharge date: 09/12/2014  Admission Diagnoses: Acute calculus cholecystitis   Discharge Diagnoses: Same Active Problems:   Cholecystitis   Acute calculous cholecystitis   PROCEDURES: Laparoscopic cholecystectomy with cholangiogram. 09/11/2014, Avel Peaceodd Rosenbower, MD   Hospital Course: this is a 28 year old female who awoke yesterday morning with some transient epigastric pain. It resolved and she was able to go throughout her day. Last night however she had significant pain just to the right the umbilicus that was severe and was associated with nausea and vomiting. No fever or chills. She presented to the emergency department and had findings concerning for for acute cholecystitis. This is based on CT and ultrasound. Liver function tests were not elevated. White blood cell count was elevated. She was admitted to the hospital.  She underwent Cholecystectomy on 10/29 by DR. Rosenbower.   She did well post op her diet was advanced and she was ready for discharge the following AM.  Follow up as noted below.  Condition on d/c:  Improved.    Disposition: 01-Home or Self Care     Medication List         acetaminophen 325 MG tablet  Commonly known as:  TYLENOL  Take 2 tablets (650 mg total) by mouth every 6 (six) hours as needed.     amphetamine-dextroamphetamine 20 MG tablet  Commonly known as:  ADDERALL  Take 20 mg by mouth 2 (two) times daily.     drospirenone-ethinyl estradiol 3-0.02 MG tablet  Commonly known as:  YAZ,GIANVI,LORYNA  Take 1 tablet by mouth daily.     HYDROcodone-acetaminophen 5-325 MG per tablet  Commonly known as:  NORCO/VICODIN  Take 1-2 tablets by mouth every 4 (four) hours as needed for moderate pain.     ibuprofen 200 MG tablet  Commonly known as:  MOTRIN IB  You can take 2-3 tablets every 6 hours for pain as needed.     loratadine 10 MG tablet  Commonly known as:  CLARITIN  Take 10 mg by mouth daily.     ondansetron 4 MG tablet  Commonly known as:  ZOFRAN  Take 1 tablet (4 mg total) by mouth every 6 (six) hours as needed for nausea.     valACYclovir 1000 MG tablet  Commonly known as:  VALTREX  Take 1,000 mg by mouth daily.       Follow-up Information   Follow up with ROSENBOWER,TODD J, MD. Schedule an appointment as soon as possible for a visit in 2 weeks. (If you do not hear from the office by Monday, call and ask for an appointmet with [facility] or Dr. Abbey Chattersosenbower.)    Specialty:  General Surgery   Contact information:   [address] [place] [address] [phone]       Signed: Sherrie GeorgeJENNINGS,Samatha Anspach 09/13/2014, 2:04 PM

## 2014-09-15 ENCOUNTER — Encounter (HOSPITAL_COMMUNITY): Payer: Self-pay | Admitting: General Surgery

## 2015-06-16 ENCOUNTER — Other Ambulatory Visit: Payer: Self-pay | Admitting: Obstetrics and Gynecology

## 2015-06-18 LAB — CYTOLOGY - PAP

## 2015-11-15 NOTE — L&D Delivery Note (Signed)
Operative Delivery Note At 9:36 AM a viable female was delivered via .  Presentation: vertex; Position: Right,, Occiput,, Anterior; Station: +3.  Verbal consent: obtained from patient.  Risks and benefits discussed in detail.  Risks include, but are not limited to the risks of anesthesia, bleeding, infection, damage to maternal tissues, fetal cephalhematoma.  There is also the risk of inability to effect vaginal delivery of the head, or shoulder dystocia that cannot be resolved by established maneuvers, leading to the need for emergency cesarean section.  APGAR: 9 9  weight 4 lb 14.8 oz (2235 g).   Placenta status spontaneously with 3 vessel cord , .   Cord:  with the following complications: .  Cord pH: not obtained  Anesthesia:  epidural Instruments: mushroom Episiotomy:  none Lacerations: first  Suture Repair: 3.0 chromic Est. Blood Loss (mL):  300  Mom to postpartum.  Baby to Couplet care / Skin to Skin.  Kohler Pellerito L 11/02/2016, 9:45 AM

## 2016-04-12 LAB — OB RESULTS CONSOLE GC/CHLAMYDIA
Chlamydia: NEGATIVE
Gonorrhea: NEGATIVE

## 2016-04-12 LAB — OB RESULTS CONSOLE HEPATITIS B SURFACE ANTIGEN: Hepatitis B Surface Ag: NEGATIVE

## 2016-04-12 LAB — OB RESULTS CONSOLE ABO/RH: RH TYPE: POSITIVE

## 2016-04-12 LAB — OB RESULTS CONSOLE ANTIBODY SCREEN: ANTIBODY SCREEN: NEGATIVE

## 2016-04-12 LAB — OB RESULTS CONSOLE RUBELLA ANTIBODY, IGM: Rubella: UNDETERMINED

## 2016-04-12 LAB — OB RESULTS CONSOLE RPR: RPR: NONREACTIVE

## 2016-04-12 LAB — OB RESULTS CONSOLE HIV ANTIBODY (ROUTINE TESTING): HIV: NONREACTIVE

## 2016-06-20 ENCOUNTER — Encounter (HOSPITAL_COMMUNITY): Payer: Self-pay | Admitting: Emergency Medicine

## 2016-06-20 ENCOUNTER — Emergency Department (HOSPITAL_COMMUNITY): Payer: BLUE CROSS/BLUE SHIELD

## 2016-06-20 ENCOUNTER — Emergency Department (HOSPITAL_COMMUNITY)
Admission: EM | Admit: 2016-06-20 | Discharge: 2016-06-20 | Disposition: A | Discharge status: Home / Self Care | Payer: BLUE CROSS/BLUE SHIELD | Attending: Emergency Medicine | Admitting: Emergency Medicine

## 2016-06-20 DIAGNOSIS — S50811A Abrasion of right forearm, initial encounter: Secondary | ICD-10-CM | POA: Diagnosis not present

## 2016-06-20 DIAGNOSIS — O9A212 Injury, poisoning and certain other consequences of external causes complicating pregnancy, second trimester: Secondary | ICD-10-CM | POA: Insufficient documentation

## 2016-06-20 DIAGNOSIS — Y999 Unspecified external cause status: Secondary | ICD-10-CM | POA: Insufficient documentation

## 2016-06-20 DIAGNOSIS — Z3A18 18 weeks gestation of pregnancy: Secondary | ICD-10-CM | POA: Diagnosis not present

## 2016-06-20 DIAGNOSIS — Y92410 Unspecified street and highway as the place of occurrence of the external cause: Secondary | ICD-10-CM | POA: Insufficient documentation

## 2016-06-20 DIAGNOSIS — Y939 Activity, unspecified: Secondary | ICD-10-CM | POA: Diagnosis not present

## 2016-06-20 DIAGNOSIS — S060X9A Concussion with loss of consciousness of unspecified duration, initial encounter: Secondary | ICD-10-CM | POA: Diagnosis not present

## 2016-06-20 LAB — KLEIHAUER-BETKE STAIN
# Vials RhIg: 1
Fetal Cells %: 0 %
Quantitation Fetal Hemoglobin: 0 mL
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
## 2016-06-20 NOTE — ED Notes (Signed)
Pt taken to CT and xray

## 2016-06-20 NOTE — Discharge Instructions (Signed)
Return here as needed. Follow up with your OB this week.
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
## 2016-10-18 LAB — OB RESULTS CONSOLE GBS: GBS: POSITIVE

## 2016-10-24 ENCOUNTER — Inpatient Hospital Stay (HOSPITAL_COMMUNITY): Payer: BLUE CROSS/BLUE SHIELD

## 2016-10-24 ENCOUNTER — Inpatient Hospital Stay (HOSPITAL_COMMUNITY)
Admission: AD | Admit: 2016-10-24 | Discharge: 2016-10-24 | Disposition: A | Discharge status: Home / Self Care | Payer: BLUE CROSS/BLUE SHIELD | Source: Ambulatory Visit | Attending: Obstetrics and Gynecology | Admitting: Obstetrics and Gynecology

## 2016-10-24 ENCOUNTER — Encounter (HOSPITAL_COMMUNITY): Payer: Self-pay | Admitting: *Deleted

## 2016-10-24 DIAGNOSIS — O9A213 Injury, poisoning and certain other consequences of external causes complicating pregnancy, third trimester: Secondary | ICD-10-CM | POA: Diagnosis not present

## 2016-10-24 DIAGNOSIS — O368390 Maternal care for abnormalities of the fetal heart rate or rhythm, unspecified trimester, not applicable or unspecified: Secondary | ICD-10-CM

## 2016-10-24 DIAGNOSIS — O26893 Other specified pregnancy related conditions, third trimester: Secondary | ICD-10-CM | POA: Insufficient documentation

## 2016-10-24 DIAGNOSIS — IMO0002 Reserved for concepts with insufficient information to code with codable children: Secondary | ICD-10-CM

## 2016-10-24 DIAGNOSIS — S3991XA Unspecified injury of abdomen, initial encounter: Secondary | ICD-10-CM

## 2016-10-24 DIAGNOSIS — Z3A36 36 weeks gestation of pregnancy: Secondary | ICD-10-CM | POA: Diagnosis not present

## 2016-10-24 HISTORY — DX: Other specified health status: Z78.9

## 2016-10-24 LAB — URINALYSIS, ROUTINE W REFLEX MICROSCOPIC
Bilirubin Urine: NEGATIVE
GLUCOSE, UA: NEGATIVE mg/dL
HGB URINE DIPSTICK: NEGATIVE
Ketones, ur: NEGATIVE mg/dL
Leukocytes, UA: NEGATIVE
Nitrite: NEGATIVE
Protein, ur: NEGATIVE mg/dL
SPECIFIC GRAVITY, URINE: 1.006 (ref 1.005–1.030)
pH: 7 (ref 5.0–8.0)
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
## 2016-11-01 ENCOUNTER — Encounter (HOSPITAL_COMMUNITY): Payer: Self-pay

## 2016-11-01 ENCOUNTER — Inpatient Hospital Stay (HOSPITAL_COMMUNITY)
Admission: AD | Admit: 2016-11-01 | Discharge: 2016-11-02 | Disposition: A | Discharge status: Home / Self Care | Payer: BLUE CROSS/BLUE SHIELD | Source: Ambulatory Visit | Attending: Obstetrics and Gynecology | Admitting: Obstetrics and Gynecology

## 2016-11-01 DIAGNOSIS — Z3A37 37 weeks gestation of pregnancy: Secondary | ICD-10-CM | POA: Insufficient documentation

## 2016-11-01 NOTE — MAU Note (Signed)
Pt reports contractions , some spotting but had membranes stripped today.

## 2016-11-02 ENCOUNTER — Inpatient Hospital Stay (HOSPITAL_COMMUNITY)
Admission: AD | Admit: 2016-11-02 | Discharge: 2016-11-04 | DRG: 775 | Disposition: A | Discharge status: Home / Self Care | Payer: BLUE CROSS/BLUE SHIELD | Source: Ambulatory Visit | Attending: Obstetrics and Gynecology | Admitting: Obstetrics and Gynecology

## 2016-11-02 ENCOUNTER — Encounter (HOSPITAL_COMMUNITY): Payer: Self-pay | Admitting: *Deleted

## 2016-11-02 ENCOUNTER — Inpatient Hospital Stay (HOSPITAL_COMMUNITY): Payer: BLUE CROSS/BLUE SHIELD | Admitting: Anesthesiology

## 2016-11-02 DIAGNOSIS — O4292 Full-term premature rupture of membranes, unspecified as to length of time between rupture and onset of labor: Secondary | ICD-10-CM | POA: Diagnosis present

## 2016-11-02 DIAGNOSIS — O365930 Maternal care for other known or suspected poor fetal growth, third trimester, not applicable or unspecified: Secondary | ICD-10-CM | POA: Diagnosis present

## 2016-11-02 DIAGNOSIS — O99824 Streptococcus B carrier state complicating childbirth: Secondary | ICD-10-CM | POA: Diagnosis present

## 2016-11-02 DIAGNOSIS — Z3A37 37 weeks gestation of pregnancy: Secondary | ICD-10-CM | POA: Diagnosis not present

## 2016-11-02 DIAGNOSIS — Z8249 Family history of ischemic heart disease and other diseases of the circulatory system: Secondary | ICD-10-CM

## 2016-11-02 DIAGNOSIS — Z823 Family history of stroke: Secondary | ICD-10-CM

## 2016-11-02 DIAGNOSIS — Z833 Family history of diabetes mellitus: Secondary | ICD-10-CM | POA: Diagnosis not present

## 2016-11-02 DIAGNOSIS — Z3490 Encounter for supervision of normal pregnancy, unspecified, unspecified trimester: Secondary | ICD-10-CM

## 2016-11-02 LAB — CBC
HCT: 38.3 % (ref 36.0–46.0)
HEMOGLOBIN: 13.8 g/dL (ref 12.0–15.0)
MCH: 34.2 pg — AB (ref 26.0–34.0)
MCHC: 36 g/dL (ref 30.0–36.0)
MCV: 94.8 fL (ref 78.0–100.0)
PLATELETS: 162 10*3/uL (ref 150–400)
RBC: 4.04 MIL/uL (ref 3.87–5.11)
RDW: 14 % (ref 11.5–15.5)
WBC: 14.7 10*3/uL — AB (ref 4.0–10.5)

## 2016-11-02 LAB — RPR: RPR: NONREACTIVE

## 2016-11-02 LAB — TYPE AND SCREEN
ABO/RH(D): O POS
ANTIBODY SCREEN: NEGATIVE

## 2016-11-02 LAB — ABO/RH: ABO/RH(D): O POS

## 2016-11-02 LAB — POCT FERN TEST: POCT Fern Test: POSITIVE

## 2016-11-02 MED ORDER — SIMETHICONE 80 MG PO CHEW: 80.0000 mg | CHEWABLE_TABLET | ORAL | Status: DC | PRN

## 2016-11-02 MED ORDER — FENTANYL 2.5 MCG/ML BUPIVACAINE 1/10 % EPIDURAL INFUSION (WH - ANES)
14.0000 mL/h | INTRAMUSCULAR | Status: DC | PRN
  Administered 2016-11-02: 12 mL/h via EPIDURAL
  Filled 2016-11-02: qty 100

## 2016-11-02 MED ORDER — PRENATAL MULTIVITAMIN CH
1.0000 | ORAL_TABLET | Freq: Every day | ORAL | Status: DC
  Administered 2016-11-02 – 2016-11-03 (×2): 1 via ORAL
  Filled 2016-11-02 (×2): qty 1

## 2016-11-02 MED ORDER — LACTATED RINGERS IV SOLN: INTRAVENOUS | Status: DC

## 2016-11-02 MED ORDER — OXYTOCIN 40 UNITS IN LACTATED RINGERS INFUSION - SIMPLE MED
2.5000 [IU]/h | INTRAVENOUS | Status: DC
  Administered 2016-11-02: 39.96 [IU]/h via INTRAVENOUS
  Filled 2016-11-02: qty 1000

## 2016-11-02 MED ORDER — ONDANSETRON HCL 4 MG/2ML IJ SOLN: 4.0000 mg | INTRAMUSCULAR | Status: DC | PRN

## 2016-11-02 MED ORDER — ACETAMINOPHEN 325 MG PO TABS
650.0000 mg | ORAL_TABLET | ORAL | Status: DC | PRN
  Filled 2016-11-02: qty 2

## 2016-11-02 MED ORDER — PHENYLEPHRINE 40 MCG/ML (10ML) SYRINGE FOR IV PUSH (FOR BLOOD PRESSURE SUPPORT): 80.0000 ug | PREFILLED_SYRINGE | INTRAVENOUS | Status: DC | PRN

## 2016-11-02 MED ORDER — DIPHENHYDRAMINE HCL 50 MG/ML IJ SOLN: 12.5000 mg | INTRAMUSCULAR | Status: DC | PRN

## 2016-11-02 MED ORDER — LIDOCAINE HCL (PF) 1 % IJ SOLN
30.0000 mL | INTRAMUSCULAR | Status: DC | PRN
  Filled 2016-11-02: qty 30

## 2016-11-02 MED ORDER — SOD CITRATE-CITRIC ACID 500-334 MG/5ML PO SOLN: 30.0000 mL | ORAL | Status: DC | PRN

## 2016-11-02 MED ORDER — ACETAMINOPHEN 325 MG PO TABS: 650.0000 mg | ORAL_TABLET | ORAL | Status: DC | PRN

## 2016-11-02 MED ORDER — EPHEDRINE 5 MG/ML INJ: 10.0000 mg | INTRAVENOUS | Status: DC | PRN

## 2016-11-02 MED ORDER — EPHEDRINE 5 MG/ML INJ
10.0000 mg | INTRAVENOUS | Status: DC | PRN
  Filled 2016-11-02: qty 4

## 2016-11-02 MED ORDER — COCONUT OIL OIL
1.0000 "application " | TOPICAL_OIL | Status: DC | PRN
  Administered 2016-11-03: 1 via TOPICAL
  Filled 2016-11-02: qty 120

## 2016-11-02 MED ORDER — LIDOCAINE HCL (PF) 1 % IJ SOLN
INTRAMUSCULAR | Status: DC | PRN
  Administered 2016-11-02: 3 mL
  Administered 2016-11-02: 5 mL
  Administered 2016-11-02: 2 mL

## 2016-11-02 MED ORDER — DIPHENHYDRAMINE HCL 25 MG PO CAPS: 25.0000 mg | ORAL_CAPSULE | Freq: Four times a day (QID) | ORAL | Status: DC | PRN

## 2016-11-02 MED ORDER — AMPICILLIN SODIUM 2 G IJ SOLR
2.0000 g | Freq: Once | INTRAMUSCULAR | Status: AC
  Administered 2016-11-02: 2 g via INTRAVENOUS
  Filled 2016-11-02: qty 2000

## 2016-11-02 MED ORDER — SOD CITRATE-CITRIC ACID 500-334 MG/5ML PO SOLN
30.0000 mL | ORAL | Status: DC | PRN
  Filled 2016-11-02 (×2): qty 15

## 2016-11-02 MED ORDER — MEASLES, MUMPS & RUBELLA VAC ~~LOC~~ INJ
0.5000 mL | INJECTION | Freq: Once | SUBCUTANEOUS | Status: DC
  Filled 2016-11-02: qty 0.5

## 2016-11-02 MED ORDER — ACETAMINOPHEN 325 MG PO TABS
650.0000 mg | ORAL_TABLET | ORAL | Status: DC | PRN
  Administered 2016-11-02: 650 mg via ORAL

## 2016-11-02 MED ORDER — DIBUCAINE 1 % RE OINT: 1.0000 "application " | TOPICAL_OINTMENT | RECTAL | Status: DC | PRN

## 2016-11-02 MED ORDER — BISACODYL 10 MG RE SUPP: 10.0000 mg | Freq: Every day | RECTAL | Status: DC | PRN

## 2016-11-02 MED ORDER — ONDANSETRON HCL 4 MG PO TABS
4.0000 mg | ORAL_TABLET | ORAL | Status: DC | PRN
Start: 2016-11-02 — End: 2016-11-04

## 2016-11-02 MED ORDER — LACTATED RINGERS IV SOLN: 500.0000 mL | Freq: Once | INTRAVENOUS | Status: DC

## 2016-11-02 MED ORDER — ZOLPIDEM TARTRATE 5 MG PO TABS: 5.0000 mg | ORAL_TABLET | Freq: Every evening | ORAL | Status: DC | PRN

## 2016-11-02 MED ORDER — OXYTOCIN BOLUS FROM INFUSION: 500.0000 mL | Freq: Once | INTRAVENOUS | Status: DC

## 2016-11-02 MED ORDER — BENZOCAINE-MENTHOL 20-0.5 % EX AERO: 1.0000 "application " | INHALATION_SPRAY | CUTANEOUS | Status: DC | PRN

## 2016-11-02 MED ORDER — SENNOSIDES-DOCUSATE SODIUM 8.6-50 MG PO TABS
2.0000 | ORAL_TABLET | ORAL | Status: DC
  Administered 2016-11-02 – 2016-11-04 (×2): 2 via ORAL
  Filled 2016-11-02 (×2): qty 2

## 2016-11-02 MED ORDER — OXYTOCIN 40 UNITS IN LACTATED RINGERS INFUSION - SIMPLE MED: 2.5000 [IU]/h | INTRAVENOUS | Status: DC

## 2016-11-02 MED ORDER — TERBUTALINE SULFATE 1 MG/ML IJ SOLN
INTRAMUSCULAR | Status: AC
  Filled 2016-11-02: qty 1

## 2016-11-02 MED ORDER — FLEET ENEMA 7-19 GM/118ML RE ENEM: 1.0000 | ENEMA | Freq: Every day | RECTAL | Status: DC | PRN

## 2016-11-02 MED ORDER — IBUPROFEN 600 MG PO TABS: 600.0000 mg | ORAL_TABLET | Freq: Four times a day (QID) | ORAL | Status: DC

## 2016-11-02 MED ORDER — WITCH HAZEL-GLYCERIN EX PADS: 1.0000 "application " | MEDICATED_PAD | CUTANEOUS | Status: DC | PRN

## 2016-11-02 MED ORDER — TETANUS-DIPHTH-ACELL PERTUSSIS 5-2.5-18.5 LF-MCG/0.5 IM SUSP: 0.5000 mL | Freq: Once | INTRAMUSCULAR | Status: DC

## 2016-11-02 MED ORDER — OXYTOCIN BOLUS FROM INFUSION
500.0000 mL | Freq: Once | INTRAVENOUS | Status: AC
  Administered 2016-11-02: 500 mL via INTRAVENOUS

## 2016-11-02 MED ORDER — PHENYLEPHRINE 40 MCG/ML (10ML) SYRINGE FOR IV PUSH (FOR BLOOD PRESSURE SUPPORT)
80.0000 ug | PREFILLED_SYRINGE | INTRAVENOUS | Status: DC | PRN
  Filled 2016-11-02: qty 5
  Filled 2016-11-02: qty 10

## 2016-11-02 MED ORDER — ONDANSETRON HCL 4 MG/2ML IJ SOLN: 4.0000 mg | Freq: Four times a day (QID) | INTRAMUSCULAR | Status: DC | PRN

## 2016-11-02 MED ORDER — MEDROXYPROGESTERONE ACETATE 150 MG/ML IM SUSP: 150.0000 mg | INTRAMUSCULAR | Status: DC | PRN

## 2016-11-02 MED ORDER — OXYCODONE-ACETAMINOPHEN 5-325 MG PO TABS: 1.0000 | ORAL_TABLET | ORAL | Status: DC | PRN

## 2016-11-02 MED ORDER — PHENYLEPHRINE 40 MCG/ML (10ML) SYRINGE FOR IV PUSH (FOR BLOOD PRESSURE SUPPORT)
80.0000 ug | PREFILLED_SYRINGE | INTRAVENOUS | Status: DC | PRN
  Filled 2016-11-02: qty 5

## 2016-11-02 MED ORDER — OXYCODONE-ACETAMINOPHEN 5-325 MG PO TABS: 2.0000 | ORAL_TABLET | ORAL | Status: DC | PRN

## 2016-11-02 MED ORDER — LACTATED RINGERS IV SOLN: 500.0000 mL | INTRAVENOUS | Status: DC | PRN

## 2016-11-02 MED ORDER — IBUPROFEN 600 MG PO TABS
600.0000 mg | ORAL_TABLET | Freq: Four times a day (QID) | ORAL | Status: DC
  Administered 2016-11-02 – 2016-11-04 (×5): 600 mg via ORAL
  Filled 2016-11-02 (×6): qty 1

## 2016-11-02 NOTE — Lactation Note (Signed)
This note was copied from a baby's chart. Lactation Consultation Note  [name] [id number]'s Date: 11/02/2016 Reason for consult: Initial assessment;Infant < 6lbs   Initial consult with first time mom of < 1 hour old infant in [facility]. Infant bundled and being held by FOB. Infant weight noted to be 4 lb 14.8 oz. 37w 6d GA.   Infant placed STS with mom, he was cueing to feed. Mom with small firm breasts with very small flat nipples. Breasts and areola re not easily compressible and nipples invert with compression. Colostrum was noted to both breasts. Mom reports + breast changes with pregnancy.   Attempted to latch infant to right breast infant attempted to latch and was unable to. He then was placed to left breast and attempted to latch without su;ccess. Initiated # 16 NS to left breast, infant did latch and was noted to have rhythmic suckling and intermittent swallows. He fed for 20 minutes and then was spoon fed 1 cc colostrum. He was continuing to cue to feed and was latched to right breast with the # 16 NS, he latched and fed for about 15 minutes. He was then fed 1 cc colostrum via spoon. He tolerated spoon feeding well.   Shared LPT infant policy with parents due to infant size, discussed with parents that infant will most likely need supplementation of formula due to size. Discussed mom beginning to pump and hand express to obtain colostrum to supplement infant with and if not enough colostrum will need to use formula, parents voiced understanding. Discussed decreased stim to infant. Discussed infant needs to be fed at least every 3 hours and to limit feedings to 30 minutes.   Will need follow up later today and will need DEBP set up.    Maternal Data Formula Feeding for Exclusion: No Has patient been taught Hand Expression?: Yes Does the patient have breastfeeding experience prior to this delivery?: No  Feeding Feeding Type: Breast Fed Length of feed: 30  min  LATCH Score/Interventions Latch: Grasps breast easily, tongue down, lips flanged, rhythmical sucking.  Audible Swallowing: A few with stimulation Intervention(s): Skin to skin;Hand expression;Alternate breast massage  Type of Nipple: Flat  Comfort (Breast/Nipple): Soft / non-tender     Hold (Positioning): Assistance needed to correctly position infant at breast and maintain latch. Intervention(s): Breastfeeding basics reviewed;Support Pillows;Position options;Skin to skin  LATCH Score: 7  Lactation Tools Discussed/Used Tools: Nipple Shields Nipple shield size: 16 WIC Program: No   Consult Status Consult Status: Follow-up Date: 11/02/16 Follow-up type: In-patient    Breanna Davis 11/02/2016, 11:27 AM

## 2016-11-02 NOTE — Consult Note (Signed)
[facility]  Delivery Note:  SVD    11/02/2016  10:00 AM  I was called to the delivery room at the request of the patient's obstetrician (Dr. Grewall) for vacuum extraction and prolonged fetal bradycardia.  PRENATAL HX:  This is a 30 y/o G1P0 at 37 and 6/[redacted] weeks gestation who was admtited this morning with SROM and labor.  Fluid was clear and ROM was ~7 hours.  Her pregnancy has been complicated by IUGR.  Delivery was complicated by a prolonged deceleration (~10 minutes) and was assisted by vacuum.  She is GBS positive and received adequate treatment.    DELIVERY:  Infant was vigorous at delivery, requiring no resuscitation other than standard warming, drying and stimulation.  APGARs 8 and 9.  Exam notable for molding along vacuum site but was otherwise within normal limits.  He is small but was > 2235g on assessment in LDR.  After 5 minutes, baby left with nurse to assist parents with skin-to-skin care.   _____________________ Electronically Signed By: Diannia Hogenson, MD Neonatologist  

## 2016-11-02 NOTE — Progress Notes (Signed)
Report called to Dr. Renaldo FiddlerAdkins. Orders received to D/C home.

## 2016-11-02 NOTE — Anesthesia Preprocedure Evaluation (Signed)
Anesthesia Evaluation  Patient identified by MRN, date of birth, ID band Patient awake    Reviewed: Allergy & Precautions, NPO status , Patient's Chart, lab work & pertinent test results  Airway Mallampati: II  TM Distance: >3 FB     Dental   Pulmonary neg pulmonary ROS,    Pulmonary exam normal        Cardiovascular negative cardio ROS Normal cardiovascular exam     Neuro/Psych negative neurological ROS     GI/Hepatic negative GI ROS, Neg liver ROS,   Endo/Other  negative endocrine ROS  Renal/GU negative Renal ROS     Musculoskeletal   Abdominal   Peds  Hematology   Anesthesia Other Findings   Reproductive/Obstetrics (+) Pregnancy                             Lab Results  Component Value Date   WBC 14.7 (H) 11/02/2016   HGB 13.8 11/02/2016   HCT 38.3 11/02/2016   MCV 94.8 11/02/2016   PLT 162 11/02/2016   Lab Results  Component Value Date   CREATININE 0.69 09/10/2014   BUN 9 09/10/2014   NA 137 09/10/2014   K 3.6 (L) 09/10/2014   CL 97 09/10/2014   CO2 26 09/10/2014  '  Anesthesia Physical Anesthesia Plan  ASA: II  Anesthesia Plan: Epidural   Post-op Pain Management:    Induction:   Airway Management Planned: Natural Airway  Additional Equipment:   Intra-op Plan:   Post-operative Plan:   Informed Consent: I have reviewed the patients History and Physical, chart, labs and discussed the procedure including the risks, benefits and alternatives for the proposed anesthesia with the patient or authorized representative who has indicated his/her understanding and acceptance.     Plan Discussed with:   Anesthesia Plan Comments:         Anesthesia Quick Evaluation

## 2016-11-02 NOTE — H&P (Signed)
Carisma Oretha Capricenderson Mehra is 30 year old G 1 P 0 at 237 w 6 days presents with SROM and labor. Patient is requesting an epidural. OB History    Gravida Para Term Preterm AB Living   1             SAB TAB Ectopic Multiple Live Births                 Past Medical History:  Diagnosis Date  . Medical history non-contributory    Past Surgical History:  Procedure Laterality Date  . ANKLE SURGERY    . CHOLECYSTECTOMY N/A 09/11/2014   Procedure: LAPAROSCOPIC CHOLECYSTECTOMY WITH INTRAOPERATIVE CHOLANGIOGRAM;  Surgeon: Avel Peaceodd Rosenbower, MD;  Location: [facility];  Service: General;  Laterality: N/A;  . CYSTOSCOPY W/ STONE MANIPULATION    . KNEE SURGERY    . TONSILLECTOMY     Family History: family history includes Diabetes in her other; Hypertension in her other; Stroke in her mother and other. Social History:  reports that she has never smoked. She has never used smokeless tobacco. She reports that she drinks alcohol. She reports that she does not use drugs.     Maternal Diabetes: No Genetic Screening: Normal Maternal Ultrasounds/Referrals: Normal Fetal Ultrasounds or other Referrals:  Referred to Materal Fetal Medicine  Maternal Substance Abuse:  No Significant Maternal Medications:  None Significant Maternal Lab Results:  None Other Comments:  None  ROS Maternal Medical History:  Reason for admission: Rupture of membranes and contractions.   Fetal activity: Perceived fetal activity is normal.    Prenatal complications: no prenatal complications   Dilation: 3.5 Effacement (%): 90 Station: -1 Exam by:: Dr. Vincente PoliGrewal Blood pressure 118/73, pulse 89, temperature 98.2 F (36.8 C), temperature source Oral, resp. rate 18, height 5' (1.524 m), weight 112 lb (50.8 kg). Maternal Exam:  Abdomen: Fetal presentation: vertex     Fetal Exam Fetal State Assessment: Category II - tracings are indeterminate.     Physical Exam  Nursing note and vitals reviewed. Constitutional: She appears  well-developed.  HENT:  Head: Normocephalic.  Eyes: Pupils are equal, round, and reactive to light.  Neck: Normal range of motion.  Cardiovascular: Normal rate and regular rhythm.     Prenatal labs: ABO, Rh:   Antibody:   Rubella:   RPR:    HBsAg:    HIV:    GBS: Positive (12/05 0000)   Assessment/Plan: IUP at 37 w 6 days SROM and Active Labor Growth Restriction Admit to L and D Monitor tracing closely - will administer epidural now If FHR becomes worrisome will proceed to C Section if remote from vaginal delivery Patient is currently progressing   Arish Redner L 11/02/2016, 8:18 AM

## 2016-11-02 NOTE — Lactation Note (Addendum)
This note was copied from a baby's chart. Lactation Consultation Note  [name] SettersLauren Hartline ZOXWR'UToday's Date: 11/02/2016 Reason for consult: Follow-up assessment;Infant < 6lbs   Follow up with mom. Breast Shells given with instructions for use and cleaning.  DEBP set up with instructions for set up, assembling, disassembling and cleaning of pump parts. Changed mom to # 21 flanges.   Feeding plan discussed with mom/dad and written on board in room: Breast feed infant at breast at first feeding cues with no longer than 3 hours between feeds Supplement infant after BF with expressed EBM/formula  Pump with DEBP for 15 minutes on Initiate setting Hand express  Call for assistance as needed  Report and POC to Terri, Charity fundraiserN.   Maternal Data Formula Feeding for Exclusion: No Has patient been taught Hand Expression?: Yes Does the patient have breastfeeding experience prior to this delivery?: No  Feeding Feeding Type: Breast Fed Length of feed: 30 min  LATCH Score/Interventions Latch: Grasps breast easily, tongue down, lips flanged, rhythmical sucking.  Audible Swallowing: A few with stimulation Intervention(s): Skin to skin;Hand expression;Alternate breast massage  Type of Nipple: Flat Intervention(s): Shells;Double electric pump  Comfort (Breast/Nipple): Soft / non-tender     Hold (Positioning): Assistance needed to correctly position infant at breast and maintain latch. Intervention(s): Breastfeeding basics reviewed;Support Pillows;Position options;Skin to skin  LATCH Score: 7  Lactation Tools Discussed/Used Tools: Nipple Shields Nipple shield size: 16 WIC Program: No Pump Review: Setup, frequency, and cleaning;Milk Storage Initiated by:: Noralee StainSharon Hice, RN,  Date initiated:: 11/02/16   Consult Status Consult Status: Follow-up Date: 11/02/16 Follow-up type: In-patient    Silas FloodSharon S Hice 11/02/2016, 12:01 PM

## 2016-11-02 NOTE — Anesthesia Procedure Notes (Signed)
Epidural Patient location during procedure: OB  Staffing Anesthesiologist: Marcene DuosFITZGERALD, Haden Suder Performed: anesthesiologist   Preanesthetic Checklist Completed: patient identified, site marked, surgical consent, pre-op evaluation, timeout performed, IV checked, risks and benefits discussed and monitors and equipment checked  Epidural Patient position: sitting Prep: site prepped and draped and DuraPrep Patient monitoring: continuous pulse ox and blood pressure Approach: midline Location: L4-L5 Injection technique: LOR saline  Needle:  Needle type: Tuohy  Needle gauge: 17 G Needle length: 9 cm and 9 Needle insertion depth: 4 cm Catheter type: closed end flexible Catheter size: 19 Gauge Catheter at skin depth: 9 cm Test dose: negative  Assessment Events: blood not aspirated, injection not painful, no injection resistance, negative IV test and no paresthesia

## 2016-11-02 NOTE — Lactation Note (Signed)
This note was copied from a baby's chart. Lactation Consultation Note  Patient Name: Boy Barbera SettersLauren Hiltz OZHYQ'MToday's Date: 11/02/2016 Reason for consult: Follow-up assessment Baby at 6 hr of life. Parents stated they were told to bf, pump, manually express, and supplement. They were not give supplementing volume guidelines. Reviewed the volumes with the LPT infant policy. Mom was agreeable to supplementing at the breast with a 5 Fr because baby is latching so well. Parents requested to use the NeoSure. Reviewed applying the NS. Demonstrated how to setup, use, and clean the 5 Fr. Baby latched well but only took 3.135ml of the supplement. Tried to offer more and baby was gagging. Left baby sts with mom and dad was cleaning the pump for mom to use after sts time. Parents have lactation phone and will call as needed. Parents will feed the baby q3hr for no more than 30 minutes, supplement per guidelines, and post express. Mom will use expressed milk first.    Maternal Data    Feeding Feeding Type: Breast Fed  LATCH Score/Interventions                      Lactation Tools Discussed/Used Tools: Nipple Shields;24F feeding tube / Syringe Nipple shield size: 16   Consult Status Consult Status: Follow-up Date: 11/03/16 Follow-up type: In-patient    Rulon Eisenmengerlizabeth E Kayveon Lennartz 11/02/2016, 4:50 PM

## 2016-11-02 NOTE — MAU Note (Addendum)
Was seen in MAU last night, was 1cm. States she has had contractions all night. States she was vomiting and all this "stuff was coming out."

## 2016-11-02 NOTE — Anesthesia Postprocedure Evaluation (Signed)
Anesthesia Post Note  Patient: Breanna Davis  Procedure(s) Performed: * No procedures listed *  Patient location during evaluation: Mother Baby Anesthesia Type: Epidural Level of consciousness: awake and awake and alert Pain management: pain level controlled Vital Signs Assessment: post-procedure vital signs reviewed and stable Respiratory status: spontaneous breathing and nonlabored ventilation Cardiovascular status: stable Postop Assessment: no headache, no backache, patient able to bend at knees, epidural receding, no signs of nausea or vomiting and adequate PO intake Anesthetic complications: no        Last Vitals:  Vitals:   11/02/16 1130 11/02/16 1233  BP: 112/69 109/68  Pulse: 84 73  Resp: 16 18  Temp: 37.3 C 36.8 C    Last Pain:  Vitals:   11/02/16 1233  TempSrc: Oral  PainSc: 0-No pain   Pain Goal:                 Land O'LakesMalinova,Naomee Nowland Hristova

## 2016-11-03 ENCOUNTER — Inpatient Hospital Stay (HOSPITAL_COMMUNITY): Admission: RE | Admit: 2016-11-03 | Payer: No Typology Code available for payment source | Source: Ambulatory Visit

## 2016-11-03 LAB — CBC
HCT: 29.1 % — ABNORMAL LOW (ref 36.0–46.0)
HEMOGLOBIN: 10.4 g/dL — AB (ref 12.0–15.0)
MCH: 34.4 pg — AB (ref 26.0–34.0)
MCHC: 35.7 g/dL (ref 30.0–36.0)
MCV: 96.4 fL (ref 78.0–100.0)
PLATELETS: 113 10*3/uL — AB (ref 150–400)
RBC: 3.02 MIL/uL — ABNORMAL LOW (ref 3.87–5.11)
RDW: 14.6 % (ref 11.5–15.5)
WBC: 13.5 10*3/uL — ABNORMAL HIGH (ref 4.0–10.5)

## 2016-11-03 NOTE — Progress Notes (Signed)
Post Partum Day 1 Subjective: no complaints, up ad lib, voiding, tolerating PO and + flatus  Objective: Blood pressure (!) 100/58, pulse 60, temperature 98.4 F (36.9 C), temperature source Oral, resp. rate 18, height 5' (1.524 m), weight 112 lb (50.8 kg), unknown if currently breastfeeding.  Physical Exam:  General: alert and cooperative Lochia: appropriate Uterine Fundus: firm Incision: healing well DVT Evaluation: No evidence of DVT seen on physical exam. Negative Homan's sign. No cords or calf tenderness. No significant calf/ankle edema.   Recent Labs  11/02/16 0808 11/03/16 0546  HGB 13.8 10.4*  HCT 38.3 29.1*    Assessment/Plan: Plan for discharge tomorrow, Breastfeeding and Circumcision prior to discharge   LOS: 1 day   CURTIS,CAROL G 11/03/2016, 7:27 AM

## 2016-11-03 NOTE — Lactation Note (Signed)
This note was copied from a baby's chart. Lactation Consultation Note:  Mom getting ready to feed as I went into room. Has been using NS and feeding tube/syringe. He had circ this morning. Attempted to latch baby without NS. Would not stay latched to breast- few sucks then off. Used NS and baby latched well. Dad assisting mom with feeding tube. Baby latched well with NS and feeding tube/syringe. Needed some stimulation to continue nursing. RN doing PKU as baby was nursing. Encouraged mom to pump after nursing to promote milk supply. Has Medela pump for home. No questions at present. To call prn  Patient Name: Breanna Barbera SettersLauren Joshua WUJWJ'XToday's Date: 11/03/2016   Maternal Data Formula Feeding for Exclusion: No Has patient been taught Hand Expression?: Yes Does the patient have breastfeeding experience prior to this delivery?: No  Feeding Feeding Type: Breast Fed  LATCH Score/Interventions Latch: Grasps breast easily, tongue down, lips flanged, rhythmical sucking.  Audible Swallowing: A few with stimulation  Type of Nipple: Flat  Comfort (Breast/Nipple): Soft / non-tender     Hold (Positioning): Assistance needed to correctly position infant at breast and maintain latch. Intervention(s): Breastfeeding basics reviewed  LATCH Score: 7  Lactation Tools Discussed/Used Tools: Nipple Shields;29F feeding tube / Syringe Nipple shield size: 16 WIC Program: No   Consult Status Consult Status: Follow-up Date: 11/04/16 Follow-up type: In-patient    Breanna Davis, Breanna Davis 11/03/2016, 12:04 PM

## 2016-11-04 MED ORDER — MEASLES, MUMPS & RUBELLA VAC ~~LOC~~ INJ: 0.5000 mL | INJECTION | Freq: Once | SUBCUTANEOUS | 0 refills | Status: AC

## 2016-11-04 MED ORDER — MEASLES, MUMPS & RUBELLA VAC ~~LOC~~ INJ
0.5000 mL | INJECTION | Freq: Once | SUBCUTANEOUS | Status: AC
  Administered 2016-11-04: 0.5 mL via SUBCUTANEOUS
  Filled 2016-11-04 (×2): qty 0.5

## 2016-11-04 NOTE — Lactation Note (Signed)
This note was copied from a baby's chart. Lactation Consultation Note  [name] Reason for consult: Follow-up assessment;Infant < 6lbs Follow up visit made prior to discharge.  Mom is currently breastfeeding using a 16 mm nipple shield and 5 french feeding tube/syringe at breast.  Baby is sucking actively and tolerating supplement.  Mom hasn't pumped much due to company.  Reinforced the importance of post pumping to establish a good milk supply,  Mom does have a DEBP at home.  Reviewed increasing supplement by day of life parameters.  Parents have handout.  Lactation outpatient appointment scheduled for Thursday 11/10/16. Discharge teaching done including engorgement treatment.  Parents deny questions.  Maternal Data    Feeding Feeding Type: Breast Fed Length of feed: 15 min  LATCH Score/Interventions Latch: Grasps breast easily, tongue down, lips flanged, rhythmical sucking.  Audible Swallowing: A few with stimulation  Type of Nipple: Everted at rest and after stimulation  Comfort (Breast/Nipple): Soft / non-tender     Hold (Positioning): No assistance needed to correctly position infant at breast. Intervention(s): Breastfeeding basics reviewed;Support Pillows  LATCH Score: 9  Lactation Tools Discussed/Used Tools: 19F feeding tube / Syringe;Nipple Shields Nipple shield size: 16   Consult Status Consult Status: Complete    Huston FoleyMOULDEN, Glendell Fouse S Davis, 10:33 AM

## 2016-11-04 NOTE — Discharge Summary (Signed)
Obstetric Discharge Summary Reason for Admission: onset of labor and rupture of membranes Prenatal Procedures: ultrasound Intrapartum Procedures: vacuum Postpartum Procedures: none Complications-Operative and Postpartum: 1 degree perineal laceration Hemoglobin  Date Value Ref Range Status  11/03/2016 10.4 (L) 12.0 - 15.0 g/dL Final    Comment:    DELTA CHECK NOTED REPEATED TO VERIFY    HCT  Date Value Ref Range Status  11/03/2016 29.1 (L) 36.0 - 46.0 % Final    Physical Exam:  General: alert and cooperative Lochia: appropriate Uterine Fundus: firm Incision: healing well DVT Evaluation: No evidence of DVT seen on physical exam. Negative Homan's sign. No cords or calf tenderness. No significant calf/ankle edema.  Discharge Diagnoses: Term Pregnancy-delivered  Discharge Information: Date: 11/04/2016 Activity: pelvic rest Diet: routine Medications: PNV and Ibuprofen Condition: stable Instructions: refer to practice specific booklet Discharge to: home   Newborn Data: Live born female  Birth Weight: 4 lb 14.8 oz (2235 g) APGAR: 8, 9  Home with mother.  Ariday Brinker G 11/04/2016, 8:24 AM

## 2016-11-10 ENCOUNTER — Ambulatory Visit (HOSPITAL_COMMUNITY): Payer: No Typology Code available for payment source
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
## 2018-07-12 LAB — OB RESULTS CONSOLE ABO/RH: RH Type: POSITIVE

## 2018-07-12 LAB — OB RESULTS CONSOLE GC/CHLAMYDIA
CHLAMYDIA, DNA PROBE: NEGATIVE
GC PROBE AMP, GENITAL: NEGATIVE

## 2018-07-12 LAB — OB RESULTS CONSOLE RPR: RPR: NONREACTIVE

## 2018-07-12 LAB — OB RESULTS CONSOLE RUBELLA ANTIBODY, IGM: RUBELLA: IMMUNE

## 2018-07-12 LAB — OB RESULTS CONSOLE HEPATITIS B SURFACE ANTIGEN: Hepatitis B Surface Ag: NEGATIVE

## 2018-07-12 LAB — OB RESULTS CONSOLE HIV ANTIBODY (ROUTINE TESTING): HIV: NONREACTIVE

## 2018-07-12 LAB — OB RESULTS CONSOLE ANTIBODY SCREEN: Antibody Screen: NEGATIVE
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
## 2019-01-28 LAB — OB RESULTS CONSOLE GBS: STREP GROUP B AG: POSITIVE

## 2019-02-01 ENCOUNTER — Encounter (HOSPITAL_COMMUNITY): Payer: Self-pay | Admitting: *Deleted

## 2019-02-01 ENCOUNTER — Telehealth (HOSPITAL_COMMUNITY): Payer: Self-pay | Admitting: *Deleted

## 2019-02-01 NOTE — Telephone Encounter (Signed)
Preadmission screen  

## 2019-02-05 ENCOUNTER — Other Ambulatory Visit: Payer: Self-pay

## 2019-02-05 ENCOUNTER — Inpatient Hospital Stay (HOSPITAL_COMMUNITY)
Admission: AD | Admit: 2019-02-05 | Discharge: 2019-02-07 | DRG: 807 | Disposition: A | Discharge status: Home / Self Care | Payer: 59 | Attending: Obstetrics and Gynecology | Admitting: Obstetrics and Gynecology

## 2019-02-05 ENCOUNTER — Encounter (HOSPITAL_COMMUNITY): Payer: Self-pay | Admitting: *Deleted

## 2019-02-05 DIAGNOSIS — O26893 Other specified pregnancy related conditions, third trimester: Secondary | ICD-10-CM | POA: Diagnosis present

## 2019-02-05 DIAGNOSIS — Z3A37 37 weeks gestation of pregnancy: Secondary | ICD-10-CM

## 2019-02-05 DIAGNOSIS — O99824 Streptococcus B carrier state complicating childbirth: Principal | ICD-10-CM | POA: Diagnosis present

## 2019-02-05 LAB — CBC
HCT: 32.4 % — ABNORMAL LOW (ref 36.0–46.0)
Hemoglobin: 11 g/dL — ABNORMAL LOW (ref 12.0–15.0)
MCH: 32.5 pg (ref 26.0–34.0)
MCHC: 34 g/dL (ref 30.0–36.0)
MCV: 95.9 fL (ref 80.0–100.0)
NRBC: 0 % (ref 0.0–0.2)
Platelets: UNDETERMINED 10*3/uL (ref 150–400)
RBC: 3.38 MIL/uL — ABNORMAL LOW (ref 3.87–5.11)
RDW: 14.6 % (ref 11.5–15.5)
WBC: 5.1 10*3/uL (ref 4.0–10.5)

## 2019-02-05 LAB — ABO/RH: ABO/RH(D): O POS

## 2019-02-05 LAB — TYPE AND SCREEN
ABO/RH(D): O POS
Antibody Screen: NEGATIVE

## 2019-02-05 MED ORDER — LACTATED RINGERS IV SOLN: 500.0000 mL | INTRAVENOUS | Status: DC | PRN

## 2019-02-05 MED ORDER — PHENYLEPHRINE 40 MCG/ML (10ML) SYRINGE FOR IV PUSH (FOR BLOOD PRESSURE SUPPORT): 80.0000 ug | PREFILLED_SYRINGE | INTRAVENOUS | Status: DC | PRN

## 2019-02-05 MED ORDER — SOD CITRATE-CITRIC ACID 500-334 MG/5ML PO SOLN: 30.0000 mL | ORAL | Status: DC | PRN

## 2019-02-05 MED ORDER — OXYCODONE-ACETAMINOPHEN 5-325 MG PO TABS: 2.0000 | ORAL_TABLET | ORAL | Status: DC | PRN

## 2019-02-05 MED ORDER — FLEET ENEMA 7-19 GM/118ML RE ENEM: 1.0000 | ENEMA | RECTAL | Status: DC | PRN

## 2019-02-05 MED ORDER — OXYCODONE-ACETAMINOPHEN 5-325 MG PO TABS
1.0000 | ORAL_TABLET | ORAL | Status: DC | PRN
  Filled 2019-02-05: qty 1

## 2019-02-05 MED ORDER — OXYTOCIN BOLUS FROM INFUSION
500.0000 mL | Freq: Once | INTRAVENOUS | Status: AC
  Administered 2019-02-05: 500 mL via INTRAVENOUS

## 2019-02-05 MED ORDER — LIDOCAINE HCL (PF) 1 % IJ SOLN
30.0000 mL | INTRAMUSCULAR | Status: AC | PRN
  Administered 2019-02-05: 30 mL via SUBCUTANEOUS
  Filled 2019-02-05: qty 30

## 2019-02-05 MED ORDER — LACTATED RINGERS IV SOLN: 500.0000 mL | Freq: Once | INTRAVENOUS | Status: DC

## 2019-02-05 MED ORDER — OXYCODONE-ACETAMINOPHEN 5-325 MG PO TABS
1.0000 | ORAL_TABLET | ORAL | Status: DC | PRN
  Administered 2019-02-05: 1 via ORAL

## 2019-02-05 MED ORDER — DIPHENHYDRAMINE HCL 50 MG/ML IJ SOLN: 12.5000 mg | INTRAMUSCULAR | Status: DC | PRN

## 2019-02-05 MED ORDER — IBUPROFEN 600 MG PO TABS
600.0000 mg | ORAL_TABLET | Freq: Four times a day (QID) | ORAL | Status: DC
  Administered 2019-02-05 – 2019-02-06 (×3): 600 mg via ORAL
  Filled 2019-02-05 (×3): qty 1

## 2019-02-05 MED ORDER — EPHEDRINE 5 MG/ML INJ: 10.0000 mg | INTRAVENOUS | Status: DC | PRN

## 2019-02-05 MED ORDER — OXYTOCIN 40 UNITS IN NORMAL SALINE INFUSION - SIMPLE MED
2.5000 [IU]/h | INTRAVENOUS | Status: DC
  Filled 2019-02-05 (×2): qty 1000

## 2019-02-05 MED ORDER — FENTANYL-BUPIVACAINE-NACL 0.5-0.125-0.9 MG/250ML-% EP SOLN
12.0000 mL/h | EPIDURAL | Status: DC | PRN
  Filled 2019-02-05: qty 250

## 2019-02-05 MED ORDER — ONDANSETRON HCL 4 MG/2ML IJ SOLN: 4.0000 mg | Freq: Four times a day (QID) | INTRAMUSCULAR | Status: DC | PRN

## 2019-02-05 MED ORDER — LACTATED RINGERS IV SOLN
INTRAVENOUS | Status: DC
  Administered 2019-02-05: 20:00:00 via INTRAVENOUS

## 2019-02-05 MED ORDER — ACETAMINOPHEN 325 MG PO TABS: 650.0000 mg | ORAL_TABLET | ORAL | Status: DC | PRN

## 2019-02-05 MED ORDER — SODIUM CHLORIDE 0.9 % IV SOLN
2.0000 g | Freq: Once | INTRAVENOUS | Status: AC
  Administered 2019-02-05: 2 g via INTRAVENOUS
  Filled 2019-02-05: qty 2000
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
## 2019-02-05 NOTE — Progress Notes (Signed)
Called to pt room b/c pt started to have strong urge to push Cervix complete & pt pushed baby to +2 station FHT dropped to 80-90s and I rec vacuum assisted delivery Bell vacuum applied and 2nd degree MLE done Infant delivered with next 2 contractions.  1 popoff Infant taken to awaiting NICU team - apgars 8,9 Placenta delivered spontaneously  Episiotomy repaired w/ 3-0 vicryl rapide Fundus firm.  Mom and baby skin to skin

## 2019-02-05 NOTE — Consult Note (Signed)
Neonatology Note:   Attendance at Delivery:    I was asked by Dr. Adkins to attend this NSVD at 37 5/7 weeks due to vacuum assist. The mother is a G3P1011, GBS + with Amp x1 ~1hr prior with good prenatal care. ROM 1.5 hours before delivery, fluid clear. Infant vigorous with good spontaneous cry and tone. Needed only minimal bulb suctioning. Ap 8 at 1min (5min assigned by L&D). Lungs clear to ausc in DR. Introduced to father.  To CN to care of Pediatrician.  Kalilah Barua C. Artelia Game, MD Neonatologist 02/05/2019, 9:23 PM  

## 2019-02-06 LAB — CBC
HEMATOCRIT: 28 % — AB (ref 36.0–46.0)
Hemoglobin: 9.7 g/dL — ABNORMAL LOW (ref 12.0–15.0)
MCH: 32.9 pg (ref 26.0–34.0)
MCHC: 34.6 g/dL (ref 30.0–36.0)
MCV: 94.9 fL (ref 80.0–100.0)
Platelets: 93 10*3/uL — ABNORMAL LOW (ref 150–400)
RBC: 2.95 MIL/uL — ABNORMAL LOW (ref 3.87–5.11)
RDW: 14.5 % (ref 11.5–15.5)
WBC: 9.3 10*3/uL (ref 4.0–10.5)
nRBC: 0 % (ref 0.0–0.2)

## 2019-02-06 LAB — RPR: RPR Ser Ql: NONREACTIVE

## 2019-02-06 MED ORDER — WITCH HAZEL-GLYCERIN EX PADS: 1.0000 "application " | MEDICATED_PAD | CUTANEOUS | Status: DC | PRN

## 2019-02-06 MED ORDER — SIMETHICONE 80 MG PO CHEW: 80.0000 mg | CHEWABLE_TABLET | ORAL | Status: DC | PRN

## 2019-02-06 MED ORDER — BENZOCAINE-MENTHOL 20-0.5 % EX AERO
1.0000 "application " | INHALATION_SPRAY | CUTANEOUS | Status: DC | PRN
  Administered 2019-02-06: 1 via TOPICAL
  Filled 2019-02-06: qty 56

## 2019-02-06 MED ORDER — COCONUT OIL OIL: 1.0000 "application " | TOPICAL_OIL | Status: DC | PRN

## 2019-02-06 MED ORDER — SENNOSIDES-DOCUSATE SODIUM 8.6-50 MG PO TABS
2.0000 | ORAL_TABLET | ORAL | Status: DC
  Administered 2019-02-06: 2 via ORAL
  Filled 2019-02-06: qty 2

## 2019-02-06 MED ORDER — MEASLES, MUMPS & RUBELLA VAC IJ SOLR: 0.5000 mL | Freq: Once | INTRAMUSCULAR | Status: DC

## 2019-02-06 MED ORDER — ONDANSETRON HCL 4 MG/2ML IJ SOLN: 4.0000 mg | INTRAMUSCULAR | Status: DC | PRN

## 2019-02-06 MED ORDER — MEDROXYPROGESTERONE ACETATE 150 MG/ML IM SUSP: 150.0000 mg | INTRAMUSCULAR | Status: DC | PRN

## 2019-02-06 MED ORDER — ACETAMINOPHEN 325 MG PO TABS
650.0000 mg | ORAL_TABLET | ORAL | Status: DC | PRN
  Administered 2019-02-06 – 2019-02-07 (×3): 650 mg via ORAL
  Filled 2019-02-06 (×4): qty 2

## 2019-02-06 MED ORDER — DIBUCAINE 1 % RE OINT: 1.0000 "application " | TOPICAL_OINTMENT | RECTAL | Status: DC | PRN

## 2019-02-06 MED ORDER — PRENATAL MULTIVITAMIN CH
1.0000 | ORAL_TABLET | Freq: Every day | ORAL | Status: DC
  Administered 2019-02-07: 1 via ORAL
  Filled 2019-02-06: qty 1

## 2019-02-06 MED ORDER — TETANUS-DIPHTH-ACELL PERTUSSIS 5-2.5-18.5 LF-MCG/0.5 IM SUSP: 0.5000 mL | Freq: Once | INTRAMUSCULAR | Status: DC

## 2019-02-06 MED ORDER — DIPHENHYDRAMINE HCL 25 MG PO CAPS: 25.0000 mg | ORAL_CAPSULE | Freq: Four times a day (QID) | ORAL | Status: DC | PRN

## 2019-02-06 MED ORDER — ONDANSETRON HCL 4 MG PO TABS: 4.0000 mg | ORAL_TABLET | ORAL | Status: DC | PRN
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
## 2019-02-06 NOTE — Progress Notes (Signed)
Patient doing well No complaints. Baby doing well Uterus firm Results for orders placed or performed during the hospital encounter of 02/05/19 (from the past 24 hour(s))  CBC     Status: Abnormal   Collection Time: 02/05/19  7:45 PM  Result Value Ref Range   WBC 5.1 4.0 - 10.5 K/uL   RBC 3.38 (L) 3.87 - 5.11 MIL/uL   Hemoglobin 11.0 (L) 12.0 - 15.0 g/dL   HCT 20.6 (L) 01.5 - 61.5 %   MCV 95.9 80.0 - 100.0 fL   MCH 32.5 26.0 - 34.0 pg   MCHC 34.0 30.0 - 36.0 g/dL   RDW 37.9 43.2 - 76.1 %   Platelets PLATELET CLUMPS NOTED ON SMEAR, UNABLE TO ESTIMATE 150 - 400 K/uL   nRBC 0.0 0.0 - 0.2 %  Type and screen [facility]     Status: None   Collection Time: 02/05/19  7:45 PM  Result Value Ref Range   ABO/RH(D) O POS    Antibody Screen NEG    Sample Expiration      02/08/2019 Performed at [facility], [address], [place], [place] [address]   ABO/Rh     Status: None   Collection Time: 02/05/19  7:45 PM  Result Value Ref Range   ABO/RH(D) O POS    No rh immune globuloin      NOT A RH IMMUNE GLOBULIN CANDIDATE, PT RH POSITIVE Performed at [facility], [address] [place], [place] [address]   CBC     Status: Abnormal   Collection Time: 02/06/19  5:30 AM  Result Value Ref Range   WBC 9.3 4.0 - 10.5 K/uL   RBC 2.95 (L) 3.87 - 5.11 MIL/uL   Hemoglobin 9.7 (L) 12.0 - 15.0 g/dL   HCT 34.0 (L) 37.0 - 96.4 %   MCV 94.9 80.0 - 100.0 fL   MCH 32.9 26.0 - 34.0 pg   MCHC 34.6 30.0 - 36.0 g/dL   RDW 38.3 81.8 - 40.3 %   Platelets 93 (L) 150 - 400 K/uL   nRBC 0.0 0.0 - 0.2 %   BP 118/82 (BP Location: Left Arm)   Pulse (!) 56   Temp 98.3 F (36.8 C) (Oral)   Resp 18   Ht 5' (1.524 m)   Wt 44.7 kg   SpO2 98%   Breastfeeding Unknown   BMI 19.25 kg/m  PPD # 1  Doing well Discharge home tomorrow

## 2019-02-07 NOTE — Discharge Summary (Signed)
Obstetric Discharge Summary Reason for Admission: onset of labor Prenatal Procedures: none Intrapartum Procedures: vacuum Postpartum Procedures: none Complications-Operative and Postpartum: 2ns degree perineal laceration Hemoglobin  Date Value Ref Range Status  02/06/2019 9.7 (L) 12.0 - 15.0 g/dL Final   HCT  Date Value Ref Range Status  02/06/2019 28.0 (L) 36.0 - 46.0 % Final    Physical Exam:  General: alert, cooperative and appears stated age 33: appropriate Uterine Fundus: firm Incision: healing well, no significant drainage, no dehiscence DVT Evaluation: No evidence of DVT seen on physical exam. Negative Homan's sign. No cords or calf tenderness. No significant calf/ankle edema.  Discharge Diagnoses: Term Pregnancy-delivered  Discharge Information: Date: 02/07/2019 Activity: pelvic rest Diet: routine Medications: PNV Condition: stable Instructions: refer to practice specific booklet Discharge to: home   Newborn Data: Live born female  Birth Weight: 4 lb 13.6 oz (2200 g) APGAR: 8, 9  Newborn Delivery   Birth date/time:  02/05/2019 21:02:00 Delivery type:  Vaginal, Vacuum (Extractor)     Home with mother.  Breanna Davis 02/07/2019, 8:43 AM

## 2019-02-07 NOTE — Plan of Care (Signed)
Pt. Condition will continue to improve 

## 2019-02-14 ENCOUNTER — Inpatient Hospital Stay (HOSPITAL_COMMUNITY): Payer: 59

## 2020-11-17 ENCOUNTER — Other Ambulatory Visit: Payer: Self-pay

## 2020-11-17 ENCOUNTER — Ambulatory Visit (HOSPITAL_COMMUNITY)
Admission: RE | Admit: 2020-11-17 | Discharge: 2020-11-17 | Disposition: A | Discharge status: Home / Self Care | Payer: 59 | Source: Ambulatory Visit | Attending: Cardiovascular Disease | Admitting: Cardiovascular Disease

## 2020-11-17 ENCOUNTER — Other Ambulatory Visit (HOSPITAL_COMMUNITY): Payer: Self-pay | Admitting: Physical Medicine & Rehabilitation

## 2020-11-17 DIAGNOSIS — M79605 Pain in left leg: Secondary | ICD-10-CM | POA: Insufficient documentation

## 2020-11-17 DIAGNOSIS — M79604 Pain in right leg: Secondary | ICD-10-CM | POA: Diagnosis not present

## 2020-12-11 NOTE — Progress Notes (Unsigned)
Erroneous encounter

## 2020-12-15 ENCOUNTER — Ambulatory Visit (INDEPENDENT_AMBULATORY_CARE_PROVIDER_SITE_OTHER): Payer: 59 | Admitting: Vascular Surgery

## 2020-12-15 ENCOUNTER — Encounter (INDEPENDENT_AMBULATORY_CARE_PROVIDER_SITE_OTHER): Payer: 59 | Admitting: Cardiology

## 2020-12-15 ENCOUNTER — Encounter: Payer: Self-pay | Admitting: Vascular Surgery

## 2020-12-15 ENCOUNTER — Other Ambulatory Visit: Payer: Self-pay

## 2020-12-15 ENCOUNTER — Encounter: Payer: Self-pay | Admitting: Cardiology

## 2020-12-15 VITALS — BP 114/71 | HR 89 | Temp 98.1°F | Resp 20 | Ht 60.0 in | Wt 87.0 lb

## 2020-12-15 DIAGNOSIS — M7122 Synovial cyst of popliteal space [Baker], left knee: Secondary | ICD-10-CM
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
## 2021-11-02 LAB — OB RESULTS CONSOLE HIV ANTIBODY (ROUTINE TESTING): HIV: NONREACTIVE

## 2021-11-02 LAB — OB RESULTS CONSOLE RPR: RPR: NONREACTIVE

## 2021-11-02 LAB — OB RESULTS CONSOLE RUBELLA ANTIBODY, IGM: Rubella: IMMUNE

## 2021-11-02 LAB — OB RESULTS CONSOLE HEPATITIS B SURFACE ANTIGEN: Hepatitis B Surface Ag: NEGATIVE

## 2021-11-14 NOTE — L&D Delivery Note (Signed)
Delivery Note At 12:23 PM a viable female was delivered via  (Presentation: LOA).  APGAR: 9, 9; weight pending.   Placenta status: Spontaneous, Intact.  Cord: 3V with the following complications: none.  Cord pH: n/a  Anesthesia:  n/a Episiotomy:  n/a Lacerations:  n/a Suture Repair:  n/a Est. Blood Loss (mL):  50  Mom to postpartum.  Baby to Couplet care / Skin to Skin.  Mitchel Honour 05/25/2022, 12:32 PM

## 2021-11-26 LAB — OB RESULTS CONSOLE GC/CHLAMYDIA
Chlamydia: NEGATIVE
Neisseria Gonorrhea: NEGATIVE

## 2022-05-20 LAB — OB RESULTS CONSOLE GBS: GBS: POSITIVE

## 2022-05-24 ENCOUNTER — Encounter (HOSPITAL_COMMUNITY): Payer: Self-pay | Admitting: Obstetrics & Gynecology

## 2022-05-25 ENCOUNTER — Other Ambulatory Visit: Payer: Self-pay

## 2022-05-25 ENCOUNTER — Encounter (HOSPITAL_COMMUNITY): Payer: Self-pay | Admitting: Obstetrics & Gynecology

## 2022-05-25 ENCOUNTER — Inpatient Hospital Stay (HOSPITAL_COMMUNITY)
Admission: AD | Admit: 2022-05-25 | Discharge: 2022-05-27 | DRG: 807 | Disposition: A | Discharge status: Home / Self Care | Payer: 59 | Attending: Obstetrics & Gynecology | Admitting: Obstetrics & Gynecology

## 2022-05-25 ENCOUNTER — Inpatient Hospital Stay (HOSPITAL_COMMUNITY): Payer: 59

## 2022-05-25 DIAGNOSIS — O99824 Streptococcus B carrier state complicating childbirth: Secondary | ICD-10-CM | POA: Diagnosis present

## 2022-05-25 DIAGNOSIS — Z3A37 37 weeks gestation of pregnancy: Secondary | ICD-10-CM

## 2022-05-25 DIAGNOSIS — O365930 Maternal care for other known or suspected poor fetal growth, third trimester, not applicable or unspecified: Secondary | ICD-10-CM | POA: Diagnosis present

## 2022-05-25 DIAGNOSIS — O365990 Maternal care for other known or suspected poor fetal growth, unspecified trimester, not applicable or unspecified: Principal | ICD-10-CM | POA: Diagnosis present

## 2022-05-25 LAB — CBC
HCT: 36.8 % (ref 36.0–46.0)
Hemoglobin: 12.3 g/dL (ref 12.0–15.0)
MCH: 32.5 pg (ref 26.0–34.0)
MCHC: 33.4 g/dL (ref 30.0–36.0)
MCV: 97.1 fL (ref 80.0–100.0)
Platelets: 127 10*3/uL — ABNORMAL LOW (ref 150–400)
RBC: 3.79 MIL/uL — ABNORMAL LOW (ref 3.87–5.11)
RDW: 14.4 % (ref 11.5–15.5)
WBC: 5.3 10*3/uL (ref 4.0–10.5)
nRBC: 0 % (ref 0.0–0.2)

## 2022-05-25 LAB — RPR: RPR Ser Ql: NONREACTIVE

## 2022-05-25 LAB — TYPE AND SCREEN
ABO/RH(D): O POS
Antibody Screen: NEGATIVE

## 2022-05-25 MED ORDER — WITCH HAZEL-GLYCERIN EX PADS
1.0000 | MEDICATED_PAD | CUTANEOUS | Status: DC | PRN
Start: 2022-05-25 — End: 2022-05-27

## 2022-05-25 MED ORDER — PRENATAL MULTIVITAMIN CH
1.0000 | ORAL_TABLET | Freq: Every day | ORAL | Status: DC
  Administered 2022-05-26: 1 via ORAL
  Filled 2022-05-25: qty 1

## 2022-05-25 MED ORDER — DIPHENHYDRAMINE HCL 25 MG PO CAPS: 25.0000 mg | ORAL_CAPSULE | Freq: Four times a day (QID) | ORAL | Status: DC | PRN

## 2022-05-25 MED ORDER — PENICILLIN G POT IN DEXTROSE 60000 UNIT/ML IV SOLN
3.0000 10*6.[IU] | INTRAVENOUS | Status: DC
  Administered 2022-05-25: 3 10*6.[IU] via INTRAVENOUS
  Filled 2022-05-25: qty 50

## 2022-05-25 MED ORDER — TERBUTALINE SULFATE 1 MG/ML IJ SOLN: 0.2500 mg | Freq: Once | INTRAMUSCULAR | Status: DC | PRN

## 2022-05-25 MED ORDER — OXYTOCIN-SODIUM CHLORIDE 30-0.9 UT/500ML-% IV SOLN
1.0000 m[IU]/min | INTRAVENOUS | Status: DC
  Administered 2022-05-25: 2 m[IU]/min via INTRAVENOUS
  Filled 2022-05-25: qty 500

## 2022-05-25 MED ORDER — COCONUT OIL OIL: 1.0000 | TOPICAL_OIL | Status: DC | PRN

## 2022-05-25 MED ORDER — SENNOSIDES-DOCUSATE SODIUM 8.6-50 MG PO TABS
2.0000 | ORAL_TABLET | Freq: Every day | ORAL | Status: DC
  Administered 2022-05-26: 2 via ORAL
  Filled 2022-05-25: qty 2

## 2022-05-25 MED ORDER — OXYTOCIN-SODIUM CHLORIDE 30-0.9 UT/500ML-% IV SOLN
2.5000 [IU]/h | INTRAVENOUS | Status: DC
Start: 2022-05-25 — End: 2022-05-25
  Administered 2022-05-25: 2.5 [IU]/h via INTRAVENOUS

## 2022-05-25 MED ORDER — FENTANYL CITRATE (PF) 100 MCG/2ML IJ SOLN: 50.0000 ug | INTRAMUSCULAR | Status: DC | PRN

## 2022-05-25 MED ORDER — LACTATED RINGERS IV SOLN: INTRAVENOUS | Status: DC

## 2022-05-25 MED ORDER — TETANUS-DIPHTH-ACELL PERTUSSIS 5-2.5-18.5 LF-MCG/0.5 IM SUSY: 0.5000 mL | PREFILLED_SYRINGE | Freq: Once | INTRAMUSCULAR | Status: DC

## 2022-05-25 MED ORDER — ONDANSETRON HCL 4 MG/2ML IJ SOLN: 4.0000 mg | Freq: Four times a day (QID) | INTRAMUSCULAR | Status: DC | PRN

## 2022-05-25 MED ORDER — SODIUM CHLORIDE 0.9 % IV SOLN
5.0000 10*6.[IU] | Freq: Once | INTRAVENOUS | Status: AC
  Administered 2022-05-25: 5 10*6.[IU] via INTRAVENOUS
  Filled 2022-05-25: qty 5

## 2022-05-25 MED ORDER — OXYTOCIN BOLUS FROM INFUSION
333.0000 mL | Freq: Once | INTRAVENOUS | Status: AC
  Administered 2022-05-25: 333 mL via INTRAVENOUS

## 2022-05-25 MED ORDER — ONDANSETRON HCL 4 MG/2ML IJ SOLN: 4.0000 mg | INTRAMUSCULAR | Status: DC | PRN

## 2022-05-25 MED ORDER — DIBUCAINE (PERIANAL) 1 % EX OINT
1.0000 | TOPICAL_OINTMENT | CUTANEOUS | Status: DC | PRN
Start: 2022-05-25 — End: 2022-05-27

## 2022-05-25 MED ORDER — SIMETHICONE 80 MG PO CHEW: 80.0000 mg | CHEWABLE_TABLET | ORAL | Status: DC | PRN

## 2022-05-25 MED ORDER — LIDOCAINE HCL (PF) 1 % IJ SOLN: 30.0000 mL | INTRAMUSCULAR | Status: DC | PRN

## 2022-05-25 MED ORDER — IBUPROFEN 600 MG PO TABS
600.0000 mg | ORAL_TABLET | Freq: Four times a day (QID) | ORAL | Status: DC
  Administered 2022-05-25 – 2022-05-26 (×5): 600 mg via ORAL
  Filled 2022-05-25 (×8): qty 1

## 2022-05-25 MED ORDER — ACETAMINOPHEN 325 MG PO TABS: 650.0000 mg | ORAL_TABLET | ORAL | Status: DC | PRN

## 2022-05-25 MED ORDER — SOD CITRATE-CITRIC ACID 500-334 MG/5ML PO SOLN: 30.0000 mL | ORAL | Status: DC | PRN

## 2022-05-25 MED ORDER — ONDANSETRON HCL 4 MG PO TABS: 4.0000 mg | ORAL_TABLET | ORAL | Status: DC | PRN

## 2022-05-25 MED ORDER — OXYCODONE-ACETAMINOPHEN 5-325 MG PO TABS: 1.0000 | ORAL_TABLET | ORAL | Status: DC | PRN

## 2022-05-25 MED ORDER — BENZOCAINE-MENTHOL 20-0.5 % EX AERO
1.0000 | INHALATION_SPRAY | CUTANEOUS | Status: DC | PRN
  Filled 2022-05-25: qty 56

## 2022-05-25 MED ORDER — OXYCODONE-ACETAMINOPHEN 5-325 MG PO TABS: 2.0000 | ORAL_TABLET | ORAL | Status: DC | PRN

## 2022-05-25 MED ORDER — ZOLPIDEM TARTRATE 5 MG PO TABS: 5.0000 mg | ORAL_TABLET | Freq: Every evening | ORAL | Status: DC | PRN

## 2022-05-25 MED ORDER — LACTATED RINGERS IV SOLN: 500.0000 mL | INTRAVENOUS | Status: DC | PRN
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
## 2022-05-25 NOTE — Lactation Note (Signed)
This note was copied from a baby's chart. Lactation Consultation Note Mom chooses to formula feed.  [name] [id number] Date: 05/25/2022   Age:34 hours  Maternal Data    Feeding    LATCH Score                    Lactation Tools Discussed/Used    Interventions    Discharge    Consult Status Consult Status: Complete    Charyl Dancer 05/25/2022, 10:19 PM

## 2022-05-25 NOTE — Progress Notes (Signed)
Breanna Davis is a 36 y.o. [phone] at [redacted]w[redacted]d by ultrasound admitted for induction of labor due to IUGR.  Subjective: Feeling mild CTX  Objective: BP 110/71   Pulse (!) 54   Temp 97.7 F (36.5 C) (Oral)   Resp 16   Ht 5' (1.524 m)   Wt 45.6 kg   BMI 19.65 kg/m  No intake/output data recorded. No intake/output data recorded.  FHT:  FHR: 130 bpm, variability: moderate,  accelerations:  Present,  decelerations:  Absent UC:   regular, every 3-4 minutes SVE:   Dilation: 5 Effacement (%): 70 Station: 0 Exam by:: Helmut Hennon, MD AROM, clear  Labs: Lab Results  Component Value Date   WBC 5.3 05/25/2022   HGB 12.3 05/25/2022   HCT 36.8 05/25/2022   MCV 97.1 05/25/2022   PLT 127 (L) 05/25/2022    Assessment / Plan: Induction of labor due to IUGR,  progressing well on pitocin  Labor: Progressing normally Preeclampsia:   n/a Fetal Wellbeing:  Category I Pain Control:  Labor support without medications I/D:  n/a Anticipated MOD:  NSVD  Mitchel Honour, DO 05/25/2022, 10:59 AM

## 2022-05-26 LAB — CBC
HCT: 26.3 % — ABNORMAL LOW (ref 36.0–46.0)
Hemoglobin: 8.7 g/dL — ABNORMAL LOW (ref 12.0–15.0)
MCH: 32.7 pg (ref 26.0–34.0)
MCHC: 33.1 g/dL (ref 30.0–36.0)
MCV: 98.9 fL (ref 80.0–100.0)
Platelets: 86 10*3/uL — ABNORMAL LOW (ref 150–400)
RBC: 2.66 MIL/uL — ABNORMAL LOW (ref 3.87–5.11)
RDW: 14.4 % (ref 11.5–15.5)
WBC: 9.3 10*3/uL (ref 4.0–10.5)
nRBC: 0 % (ref 0.0–0.2)

## 2022-05-26 NOTE — Progress Notes (Signed)
PPD  # 1 Doing well.  BP 116/67 (BP Location: Left Arm)   Pulse 60   Temp 98 F (36.7 C) (Oral)   Resp 17   Ht 5' (1.524 m)   Wt 45.6 kg   Breastfeeding Unknown   BMI 19.65 kg/m  Results for orders placed or performed during the hospital encounter of 05/25/22 (from the past 24 hour(s))  CBC     Status: Abnormal   Collection Time: 05/26/22  4:47 AM  Result Value Ref Range   WBC 9.3 4.0 - 10.5 K/uL   RBC 2.66 (L) 3.87 - 5.11 MIL/uL   Hemoglobin 8.7 (L) 12.0 - 15.0 g/dL   HCT 48.5 (L) 46.2 - 70.3 %   MCV 98.9 80.0 - 100.0 fL   MCH 32.7 26.0 - 34.0 pg   MCHC 33.1 30.0 - 36.0 g/dL   RDW 50.0 93.8 - 18.2 %   Platelets 86 (L) 150 - 400 K/uL   nRBC 0.0 0.0 - 0.2 %   Uterus is firm Lochia wnl  PPD # 1  Doing well Circ tomorrow per peds Discharge home tomorrow

## 2022-05-27 NOTE — Discharge Summary (Signed)
Postpartum Discharge Summary       [name] DOB: 08-Mar-1986 MRN: [id number]  Date of admission: 05/25/2022 Delivery date:05/25/2022  Delivering provider: Linda Hedges  Date of discharge: 05/27/2022  Admitting diagnosis: IUGR (intrauterine growth restriction) affecting care of mother [O36.5990] Intrauterine pregnancy: [redacted]w[redacted]d    Secondary diagnosis:  Principal Problem:   IUGR (intrauterine growth restriction) affecting care of mother  Additional problems:      Discharge diagnosis: Term Pregnancy Delivered                                              Post partum procedures:   Augmentation: AROM Complications: None  Hospital course: Induction of Labor With Vaginal Delivery   36y.o. yo [phone] 37w0das admitted to the hospital 05/25/2022 for induction of labor.  Indication for induction:  IUGR .  Patient had an uncomplicated labor course as follows: Membrane Rupture Time/Date: 10:55 AM ,05/25/2022   Delivery Method:Vaginal, Spontaneous  Episiotomy: None  Lacerations:  None  Details of delivery can be found in separate delivery note.  Patient had a routine postpartum course. Patient is discharged home 05/27/22.  Newborn Data: Birth date:05/25/2022  Birth time:12:23 PM  Gender:Female  Living status:Living  Apgars:9 ,9  Weight:2290 g   Magnesium Sulfate received: No BMZ received: No Rhophylac:N/A MMR:N/A T-DaP:Given prenatally Flu: N/A Transfusion:No  Physical exam  Vitals:   05/25/22 2329 05/26/22 0518 05/26/22 2145 05/27/22 0514  BP: 116/80 116/67 114/68 106/70  Pulse: (!) 55 60 68 (!) 57  Resp: _0 Temp: 98 F (36.7 C) 98 F (36.7 C) 97.7 F (36.5 C) 98.3 F (36.8 C)  TempSrc: Oral Oral Oral   Weight:      Height:       General: alert, cooperative, and no distress Lochia: appropriate Uterine Fundus: firm Incision: N/A DVT Evaluation: No evidence of DVT seen on physical exam. Labs: Lab Results  Component Value Date    WBC 9.3 05/26/2022   HGB 8.7 (L) 05/26/2022   HCT 26.3 (L) 05/26/2022   MCV 98.9 05/26/2022   PLT 86 (L) 05/26/2022      Latest Ref Rng & Units 09/11/2014    3:51 AM  CMP  Total Protein 6.0 - 8.3 g/dL 6.2   Total Bilirubin 0.3 - 1.2 mg/dL 0.3   Alkaline Phos 39 - 117 U/L 60   AST 0 - 37 U/L 21   ALT 0 - 35 U/L 13    Edinburgh Score:    05/25/2022    2:45 PM  Edinburgh Postnatal Depression Scale Screening Tool  I have been able to laugh and see the funny side of things. 0  I have looked forward with enjoyment to things. 0  I have blamed myself unnecessarily when things went wrong. 1  I have been anxious or worried for no good reason. 1  I have felt scared or panicky for no good reason. 0  Things have been getting on top of me. 1  I have been so unhappy that I have had difficulty sleeping. 0  I have felt sad or miserable. 0  I have been so unhappy that I have been crying. 0  The thought of harming myself has occurred to me. 0  Edinburgh Postnatal Depression Scale Total 3      After visit  meds:  Allergies as of 05/27/2022   No Known Allergies      Medication List     TAKE these medications    amphetamine-dextroamphetamine 20 MG tablet Commonly known as: ADDERALL Take by mouth.   valACYclovir 500 MG tablet Commonly known as: VALTREX Take 1,000 mg by mouth daily as needed (for cold sores).         Discharge home in stable condition Infant Feeding: Bottle Infant Disposition:home with mother Discharge instruction: per After Visit Summary and Postpartum booklet. Activity: Advance as tolerated. Pelvic rest for 6 weeks.  Diet: routine diet Anticipated Birth Control: Unsure Postpartum Appointment:6 weeks Additional Postpartum F/U:    Future Appointments:No future appointments. Follow up Visit:      05/27/2022 Luz Lex, MD

## 2022-06-02 ENCOUNTER — Telehealth (HOSPITAL_COMMUNITY): Payer: Self-pay | Admitting: *Deleted

## 2022-06-02 NOTE — Telephone Encounter (Signed)
Mom reports feeling good. No concerns about herself at this time. EPDS=2 [facility] score=3) Mom reports baby is doing well. Feeding, peeing, and pooping without difficulty. Safe sleep reviewed. Mom reports no concerns about baby at present.  Duffy Rhody, RN 06-02-2022 at 12:34pm
